# Patient Record
Sex: Male | Born: 1955
Health system: Southern US, Community
[De-identification: ages and names within clinical notes are randomized; demographics above are authoritative.]

## PROBLEM LIST (undated history)

## (undated) DIAGNOSIS — S069XAA Unspecified intracranial injury with loss of consciousness status unknown, initial encounter: Secondary | ICD-10-CM

## (undated) DIAGNOSIS — Z720 Tobacco use: Secondary | ICD-10-CM

## (undated) DIAGNOSIS — I2510 Atherosclerotic heart disease of native coronary artery without angina pectoris: Secondary | ICD-10-CM

## (undated) DIAGNOSIS — I10 Essential (primary) hypertension: Secondary | ICD-10-CM

## (undated) DIAGNOSIS — S069X9A Unspecified intracranial injury with loss of consciousness of unspecified duration, initial encounter: Secondary | ICD-10-CM

---

## 1998-05-15 ENCOUNTER — Ambulatory Visit (HOSPITAL_COMMUNITY): Admission: RE | Admit: 1998-05-15 | Discharge: 1998-05-15 | Payer: Self-pay | Admitting: Family Medicine

## 1999-10-29 ENCOUNTER — Encounter: Payer: Self-pay | Admitting: Emergency Medicine

## 1999-10-29 ENCOUNTER — Emergency Department (HOSPITAL_COMMUNITY): Admission: EM | Admit: 1999-10-29 | Discharge: 1999-10-29 | Payer: Self-pay | Admitting: Emergency Medicine

## 2016-11-23 ENCOUNTER — Inpatient Hospital Stay (HOSPITAL_COMMUNITY)
Admission: EM | Admit: 2016-11-23 | Discharge: 2016-11-26 | DRG: 247 | Disposition: A | Discharge status: Home / Self Care | Payer: Medicaid Other | Attending: Cardiovascular Disease | Admitting: Cardiovascular Disease

## 2016-11-23 ENCOUNTER — Emergency Department (HOSPITAL_COMMUNITY): Payer: Medicaid Other

## 2016-11-23 ENCOUNTER — Encounter (HOSPITAL_COMMUNITY): Payer: Self-pay | Admitting: *Deleted

## 2016-11-23 DIAGNOSIS — E440 Moderate protein-calorie malnutrition: Secondary | ICD-10-CM | POA: Diagnosis present

## 2016-11-23 DIAGNOSIS — I10 Essential (primary) hypertension: Secondary | ICD-10-CM | POA: Diagnosis not present

## 2016-11-23 DIAGNOSIS — I2510 Atherosclerotic heart disease of native coronary artery without angina pectoris: Secondary | ICD-10-CM | POA: Diagnosis present

## 2016-11-23 DIAGNOSIS — F17200 Nicotine dependence, unspecified, uncomplicated: Secondary | ICD-10-CM | POA: Diagnosis present

## 2016-11-23 DIAGNOSIS — I25110 Atherosclerotic heart disease of native coronary artery with unstable angina pectoris: Secondary | ICD-10-CM | POA: Diagnosis present

## 2016-11-23 DIAGNOSIS — Z8249 Family history of ischemic heart disease and other diseases of the circulatory system: Secondary | ICD-10-CM | POA: Diagnosis not present

## 2016-11-23 DIAGNOSIS — I200 Unstable angina: Secondary | ICD-10-CM | POA: Diagnosis not present

## 2016-11-23 DIAGNOSIS — Z955 Presence of coronary angioplasty implant and graft: Secondary | ICD-10-CM

## 2016-11-23 DIAGNOSIS — I214 Non-ST elevation (NSTEMI) myocardial infarction: Secondary | ICD-10-CM | POA: Diagnosis not present

## 2016-11-23 DIAGNOSIS — R001 Bradycardia, unspecified: Secondary | ICD-10-CM | POA: Diagnosis present

## 2016-11-23 DIAGNOSIS — S069XAA Unspecified intracranial injury with loss of consciousness status unknown, initial encounter: Secondary | ICD-10-CM | POA: Diagnosis present

## 2016-11-23 DIAGNOSIS — Z720 Tobacco use: Secondary | ICD-10-CM | POA: Diagnosis present

## 2016-11-23 DIAGNOSIS — S069X9A Unspecified intracranial injury with loss of consciousness of unspecified duration, initial encounter: Secondary | ICD-10-CM | POA: Diagnosis present

## 2016-11-23 DIAGNOSIS — Z87820 Personal history of traumatic brain injury: Secondary | ICD-10-CM

## 2016-11-23 HISTORY — DX: Unspecified intracranial injury with loss of consciousness status unknown, initial encounter: S06.9XAA

## 2016-11-23 HISTORY — DX: Atherosclerotic heart disease of native coronary artery without angina pectoris: I25.10

## 2016-11-23 HISTORY — DX: Essential (primary) hypertension: I10

## 2016-11-23 HISTORY — DX: Unspecified intracranial injury with loss of consciousness of unspecified duration, initial encounter: S06.9X9A

## 2016-11-23 HISTORY — DX: Tobacco use: Z72.0

## 2016-11-23 LAB — CBC
HEMATOCRIT: 37.8 % — AB (ref 39.0–52.0)
HEMOGLOBIN: 12.9 g/dL — AB (ref 13.0–17.0)
MCH: 30.1 pg (ref 26.0–34.0)
MCHC: 34.1 g/dL (ref 30.0–36.0)
MCV: 88.1 fL (ref 78.0–100.0)
Platelets: 226 10*3/uL (ref 150–400)
RBC: 4.29 MIL/uL (ref 4.22–5.81)
RDW: 14.6 % (ref 11.5–15.5)
WBC: 5.2 10*3/uL (ref 4.0–10.5)

## 2016-11-23 LAB — TROPONIN I
TROPONIN I: 0.18 ng/mL — AB (ref <0.03)
TROPONIN I: 0.19 ng/mL — AB (ref <0.03)
Troponin I: 0.2 ng/mL (ref <0.03)

## 2016-11-23 LAB — I-STAT TROPONIN, ED: TROPONIN I, POC: 0.16 ng/mL — AB (ref 0.00–0.08)

## 2016-11-23 LAB — BASIC METABOLIC PANEL
ANION GAP: 8 (ref 5–15)
BUN: 9 mg/dL (ref 6–20)
CALCIUM: 9.7 mg/dL (ref 8.9–10.3)
CO2: 27 mmol/L (ref 22–32)
Chloride: 105 mmol/L (ref 101–111)
Creatinine, Ser: 0.72 mg/dL (ref 0.61–1.24)
GFR calc Af Amer: >60 mL/min (ref >60)
Glucose, Bld: 98 mg/dL (ref 65–99)
POTASSIUM: 3.6 mmol/L (ref 3.5–5.1)
SODIUM: 140 mmol/L (ref 135–145)

## 2016-11-23 LAB — GLUCOSE, CAPILLARY: Glucose-Capillary: 92 mg/dL (ref 65–99)

## 2016-11-23 LAB — HEMOGLOBIN A1C
HEMOGLOBIN A1C: 5.2 % (ref 4.8–5.6)
MEAN PLASMA GLUCOSE: 102.54 mg/dL

## 2016-11-23 MED ORDER — ASPIRIN 300 MG RE SUPP: 300.0000 mg | RECTAL | Status: AC

## 2016-11-23 MED ORDER — NITROGLYCERIN 0.4 MG SL SUBL: 0.4000 mg | SUBLINGUAL_TABLET | SUBLINGUAL | Status: DC | PRN

## 2016-11-23 MED ORDER — ONDANSETRON HCL 4 MG/2ML IJ SOLN: 4.0000 mg | Freq: Four times a day (QID) | INTRAMUSCULAR | Status: DC | PRN

## 2016-11-23 MED ORDER — ASPIRIN 81 MG PO CHEW: 81.0000 mg | CHEWABLE_TABLET | Freq: Every day | ORAL | Status: DC

## 2016-11-23 MED ORDER — ACETAMINOPHEN 325 MG PO TABS: 650.0000 mg | ORAL_TABLET | ORAL | Status: DC | PRN

## 2016-11-23 MED ORDER — ASPIRIN 81 MG PO CHEW
324.0000 mg | CHEWABLE_TABLET | Freq: Once | ORAL | Status: AC
  Administered 2016-11-23: 324 mg via ORAL
  Filled 2016-11-23: qty 4

## 2016-11-23 MED ORDER — ASPIRIN EC 81 MG PO TBEC
81.0000 mg | DELAYED_RELEASE_TABLET | Freq: Every day | ORAL | Status: DC
  Administered 2016-11-24: 81 mg via ORAL
  Filled 2016-11-23 (×2): qty 1

## 2016-11-23 MED ORDER — HYDROCHLOROTHIAZIDE 25 MG PO TABS
25.0000 mg | ORAL_TABLET | Freq: Every day | ORAL | Status: DC
  Administered 2016-11-23 – 2016-11-26 (×4): 25 mg via ORAL
  Filled 2016-11-23 (×4): qty 1

## 2016-11-23 MED ORDER — ASPIRIN 81 MG PO CHEW: 324.0000 mg | CHEWABLE_TABLET | ORAL | Status: AC

## 2016-11-23 MED ORDER — LOSARTAN POTASSIUM 50 MG PO TABS
50.0000 mg | ORAL_TABLET | Freq: Every day | ORAL | Status: DC
  Administered 2016-11-23 – 2016-11-26 (×4): 50 mg via ORAL
  Filled 2016-11-23 (×4): qty 1

## 2016-11-23 MED ORDER — HYDRALAZINE HCL 20 MG/ML IJ SOLN
5.0000 mg | Freq: Once | INTRAMUSCULAR | Status: AC
  Administered 2016-11-23: 5 mg via INTRAVENOUS
  Filled 2016-11-23: qty 1

## 2016-11-23 MED ORDER — ENOXAPARIN SODIUM 100 MG/ML ~~LOC~~ SOLN
1.0000 mg/kg | Freq: Two times a day (BID) | SUBCUTANEOUS | Status: DC
  Administered 2016-11-23 – 2016-11-25 (×4): 85 mg via SUBCUTANEOUS
  Filled 2016-11-23 (×5): qty 1

## 2016-11-23 NOTE — ED Notes (Signed)
Attempted report x1. 

## 2016-11-23 NOTE — ED Notes (Signed)
Critical troponin reported to Dr. Criss Alvine

## 2016-11-23 NOTE — Progress Notes (Signed)
ANTICOAGULATION CONSULT NOTE - Initial Consult  Pharmacy Consult for Lovenox Indication: chest pain/ACS  No Known Allergies  Patient Measurements: Weight: 185 lb (83.9 kg) Heparin Dosing Weight:   Vital Signs: Temp: 98.3 F (36.8 C) (08/18 1142) Temp Source: Oral (08/18 1142) BP: 172/108 (08/18 1430) Pulse Rate: 65 (08/18 1430)  Labs:  Recent Labs  11/23/16 1156 11/23/16 1326  HGB 12.9*  --   HCT 37.8*  --   PLT 226  --   CREATININE 0.72  --   TROPONINI  --  0.20*    CrCl cannot be calculated (Unknown ideal weight.).   Medical History: Past Medical History:  Diagnosis Date  . Hypertension     Assessment: 61 yo male admitted with neck and chest burning and discomfort. +troponin but mildly elevated Appears cardiology will manage conservatively, f/u repeat troponins  Scr 0.7, hgb 12.9, ptls wnl   Goal of Therapy:  Monitor platelets by anticoagulation protocol: Yes    Plan:  Lovenox 85 mg [place] q12h Monitor CBC F/u cardiology plans  B[place] Friday 11/23/2016,3:50 PM

## 2016-11-23 NOTE — ED Notes (Signed)
Attempted report and floor says BP is to high. Paged admitting Dr.

## 2016-11-23 NOTE — ED Triage Notes (Signed)
Pt states he got up and was walking and felt discomfort to left neck and radiated to chest with a burning sensation.  Pt states this has been intermittent, pt states it happened last time 2 days ago.

## 2016-11-23 NOTE — ED Provider Notes (Signed)
[facility]-EMERGENCY DEPT Provider Note   CSN: [id number] Arrival date & time: 11/23/16  1130     History   Chief Complaint Chief Complaint  Patient presents with  . Chest Pain    HPI Cameron West is a 61 y.o. male.  HPI  61 year old male presents with a chief complaint of chest pain. He is an overall poor historian but indicates his been having on-and-off chest pain for several months. He states it only occurs when he is doing significant activities, especially in the heat. Last had the pain yesterday but it was mild. 4 days ago the pain was a lot more severe. He states he gets shortness of breath, left arm pain, and neck pain. Sometimes nauseated but no vomiting. He does state he has a history of hypertension and has at least in the past had diabetes but he has not seen a doctor in several years. He states he smokes every once in a while.  Past Medical History:  Diagnosis Date  . Hypertension     There are no active problems to display for this patient.   History reviewed. No pertinent surgical history.     Home Medications    Prior to Admission medications   Not on File    Family History No family history on file.  Social History Social History  Substance Use Topics  . Smoking status: Current Some Day Smoker  . Smokeless tobacco: Never Used  . Alcohol use No     Allergies   Patient has no known allergies.   Review of Systems Review of Systems  Respiratory: Positive for shortness of breath.   Cardiovascular: Positive for chest pain.  Gastrointestinal: Positive for nausea. Negative for vomiting.  Musculoskeletal: Positive for neck pain.  All other systems reviewed and are negative.    Physical Exam Updated Vital Signs BP (!) 170/110 (BP Location: Left Arm)   Pulse 72   Temp 98.3 F (36.8 C) (Oral)   Resp 17   SpO2 98%   Physical Exam  Constitutional: He is oriented to person, place, and time. He appears well-developed and well-nourished. No  distress.  HENT:  Head: Normocephalic and atraumatic.  Right Ear: External ear normal.  Left Ear: External ear normal.  Nose: Nose normal.  Eyes: Right eye exhibits no discharge. Left eye exhibits no discharge.  Neck: Neck supple.  Cardiovascular: Normal rate, regular rhythm and normal heart sounds.   Pulses:      Radial pulses are 2+ on the right side, and 2+ on the left side.  Pulmonary/Chest: Effort normal and breath sounds normal. He exhibits no tenderness.  Abdominal: Soft. There is no tenderness.  Musculoskeletal: He exhibits no edema.  Neurological: He is alert and oriented to person, place, and time.  Skin: Skin is warm and dry. He is not diaphoretic.  Nursing note and vitals reviewed.    ED Treatments / Results  Labs (all labs ordered are listed, but only abnormal results are displayed) Labs Reviewed  CBC - Abnormal; Notable for the following:       Result Value   Hemoglobin 12.9 (*)    HCT 37.8 (*)    All other components within normal limits  I-STAT TROPONIN, ED - Abnormal; Notable for the following:    Troponin i, poc 0.16 (*)    All other components within normal limits  BASIC METABOLIC PANEL  TROPONIN I    EKG  EKG Interpretation  Date/Time:  Saturday November 23 2016 11:37:59 EDT Ventricular  Rate:  74 PR Interval:  178 QRS Duration: 88 QT Interval:  372 QTC Calculation: 412 R Axis:   81 Text Interpretation:  Normal sinus rhythm no acute ST/T changes No old tracing to compare Confirmed by Pricilla Loveless [phone]) on 11/23/2016 12:50:37 PM       EKG Interpretation  Date/Time:  Saturday November 23 2016 13:13:28 EDT Ventricular Rate:  72 PR Interval:  178 QRS Duration: 91 QT Interval:  376 QTC Calculation: 412 R Axis:   79 Text Interpretation:  Sinus rhythm Minimal ST elevation, anterior leads no significant change compared to earlier in the day Confirmed by Pricilla Loveless [phone]) on 11/23/2016 1:25:00 PM        Radiology Dg Chest 2  View  Result Date: 11/23/2016 CLINICAL DATA:  Pt reports left side chest pain x 2 days that happens intermittently; smoker; hx of HTN EXAM: CHEST  2 VIEW COMPARISON:  None. FINDINGS: The heart size and mediastinal contours are within normal limits. Both lungs are clear. No pleural effusion or pneumothorax. The visualized skeletal structures are unremarkable. IMPRESSION: No active cardiopulmonary disease. Electronically Signed   By: Amie Portland M.D.   On: 11/23/2016 12:31    Procedures Procedures (including critical care time)  Medications Ordered in ED Medications  aspirin chewable tablet 324 mg (324 mg Oral Given 11/23/16 1325)     Initial Impression / Assessment and Plan / ED Course  I have reviewed the triage vital signs and the nursing notes.  Pertinent labs & imaging results that were available during my care of the patient were reviewed by me and considered in my medical decision making (see chart for details).     No active chest pain now or earlier today. History of progressive angina with exertion. Now with troponin leak, though currently pain free/stable. Cardiology consulted, they will admit.  Final Clinical Impressions(s) / ED Diagnoses   Final diagnoses:  NSTEMI (non-ST elevated myocardial infarction) [facility])    New Prescriptions New Prescriptions   No medications on file     Pricilla Loveless, MD 11/23/16 [phone]

## 2016-11-23 NOTE — Consult Note (Signed)
Cardiology H&P   Patient ID: Cameron West; [id number]; May 16, 1955   Admit date: 11/23/2016 Date of Consult: 11/23/2016  Primary Care Provider: Patient, No Pcp Per Primary Cardiologist: New, Nahser     Patient Profile:   Cameron West is a 61 y.o. male with a hx of hypertension who is being seen today for the evaluation of chest pain at the request of Dr. Criss Alvine, ER, physician.   History of Present Illness:   [name] came to the emergency room at the request of his son for prior complaints of chest pain. The patient is not having chest pain at all today nor is he having chest pain in the ER. The patient has had some chest discomfort on and off when he is outside in the heat. He states that the pain begins in his left shoulder and radiates into his chest. Because he has been having this intermittently, his son felt he needed to be evaluated. The patient does not have a primary care physician. He is very vague on reasons that he does not. He apparently has had a history of hypertension but does not regularly take medications nor does he see a physician regularly. He uses the emergency room for any medical needs that he has.  On arrival in the emergency room the patient was hypertensive with a blood pressure 170/110, heart rate 72 O2 sat 98%. She was afebrile. Pertinent labs revealed a troponin of 0.20. An 0.16 respectively. Other labs include potassium 3.6, creatinine 0.72. Hemoglobin 12.9, hematocrit 37.8. EKG designated normal sinus rhythm without acute ST-T wave abnormalities. EKG revealed no active cardiopulmonary disease. The patient was treated with aspirin 324 mg. He remains hypertensive.    Past Medical History:  Diagnosis Date  . Hypertension     History reviewed. No pertinent surgical history.   Home Medications:  Prior to Admission medications   Not on File    Inpatient Medications: Scheduled Meds:  Continuous Infusions:  PRN Meds:   Allergies:   No Known  Allergies  Social History:   Social History   Social History  . Marital status: Single    Spouse name: N/A  . Number of children: N/A  . Years of education: N/A   Occupational History  . Not on file.   Social History Main Topics  . Smoking status: Current Some Day Smoker  . Smokeless tobacco: Never Used  . Alcohol use No  . Drug use: No  . Sexual activity: Not on file   Other Topics Concern  . Not on file   Social History Narrative  . No narrative on file    Family History:   Patient is a poor historian. Uncertain but thinks family history of HTN and Family History  Problem Relation Age of Onset  . Hypertension Mother      ROS:  Please see the history of present illness.  ROSAll other ROS reviewed and negative.     Physical Exam/Data:   Vitals:   11/23/16 1142 11/23/16 1400 11/23/16 1430  BP: (!) 170/110 (!) 165/99 (!) 172/108  Pulse: 72 64 65  Resp: 17 16 17   Temp: 98.3 F (36.8 C)    TempSrc: Oral    SpO2: 98% 100% 99%   No intake or output data in the 24 hours ending 11/23/16 1443 There were no vitals filed for this visit. There is no height or weight on file to calculate BMI.    General:  Well nourished, well developed, in no  acute distress HEENT: normal Lymph: no adenopathy Neck: no JVD Endocrine:  No thryomegaly Vascular: No carotid bruits; FA pulses 2+ bilaterally without bruits  Cardiac:  normal S1, S2; RRR; no murmur  Lungs:  Clear to auscultation bilaterally, no wheezing, rhonchi or rales  Abd: soft, nontender, no hepatomegaly  Ext: no edema Musculoskeletal:  No deformities, BUE and BLE strength normal and equal Skin: warm and dry  Neuro:  CNs 2-12 intact, no focal abnormalities noted Psych:  Normal affect  Poor historian. Requires frequent redirecting.   EKG:  The EKG was personally reviewed and demonstrates: NSR,  Telemetry:  Telemetry was personally reviewed and demonstrates:  SR   Relevant CV Studies: None  Laboratory  Data:  Chemistry  Recent Labs Lab 11/23/16 1156  NA 140  K 3.6  CL 105  CO2 27  GLUCOSE 98  BUN 9  CREATININE 0.72  CALCIUM 9.7  GFRNONAA >60  GFRAA >60  ANIONGAP 8    No results for input(s): PROT, ALBUMIN, AST, ALT, ALKPHOS, BILITOT in the last 168 hours. Hematology  Recent Labs Lab 11/23/16 1156  WBC 5.2  RBC 4.29  HGB 12.9*  HCT 37.8*  MCV 88.1  MCH 30.1  MCHC 34.1  RDW 14.6  PLT 226   Cardiac Enzymes  Recent Labs Lab 11/23/16 1326  TROPONINI 0.20*     Recent Labs Lab 11/23/16 1205  TROPIPOC 0.16*    BNPNo results for input(s): BNP, PROBNP in the last 168 hours.  DDimer No results for input(s): DDIMER in the last 168 hours.  Radiology/Studies:  Dg Chest 2 View  Result Date: 11/23/2016 CLINICAL DATA:  Pt reports left side chest pain x 2 days that happens intermittently; smoker; hx of HTN EXAM: CHEST  2 VIEW COMPARISON:  None. FINDINGS: The heart size and mediastinal contours are within normal limits. Both lungs are clear. No pleural effusion or pneumothorax. The visualized skeletal structures are unremarkable. IMPRESSION: No active cardiopulmonary disease. Electronically Signed   By: Amie Portland M.D.   On: 11/23/2016 12:31    Assessment and Plan:   1. Chest pain: Typical atypical features, usually associated with being out in the heat according to the patient. States he begins in his left shoulder and radiates into his chest. He denies any diaphoresis dizziness or dyspnea associated. The pain in his chest has not occurred for a few days, but on the insistence of his son he was asked to come the emergency room to be checked out further. The patient does not have a primary care physician. The patient states that he only feels the discomfort when he is outside intermittently.  EKG does not reveal any acute ST-T wave abnormalities.  Troponins are elevated however at 0.16 and 0.20. We'll plan echocardiogram for evaluation of LV function in the setting of  uncontrolled hypertension. Uncertain that he will need to proceed with catheterization at this time however this will be a possibility troponins continue to increase. We'll provide potassium replacement as he is low normal at 3.6. Creatinine is 0.72.  2. Hypertension: Uncontrolled. Patient does not take medications at home. He does not have a primary care physician nor does he see a physician regularly other than coming to the ER for any health problems. We'll begin low-dose losartan and HcTZ daily for better blood pressure control. As stated creatinine is 0.72. Will not add beta blocker as heart rate is 65 bpm current.    Signed, Joni Reining, NP  11/23/2016 2:43 PM   Attending  Note:   The patient was seen and examined.  Agree with assessment and plan as noted above.  Changes made to the above note as needed.  Patient seen and independently examined with Joni Reining, NP .   We discussed all aspects of the encounter. I agree with the assessment and plan as stated above.  1. Chest discomfort: Symptoms are consistent with unstable angina. His history is somewhat questional. He tends to ramble quite a bit in his discussion. I'm concerned about the onset of chest pain with radiation to the neck and shoulder that has been occurring for least several months. While these can happen at anytime, but seemed to be more common when he is hot or when he is exerting himself.  He's EKG is normal. His troponin level is very minimally elevated.  Clinically it does not appear that he's had a pulmonary embolus. He is not short of breath at rest and he is not hypoxic or tachycardic.  We'll admit and cycle cardiac enzymes. If his troponins increased further, will need to consider heart catheterization.  We'll start him on aspirin.   2. Essential hypertension: His blood pressures in the 170s. He has not been on any blood pressure medications. He does not go to see the doctor on any regular basis. We'll  start him on an H2 tends in blocker and HCTZ. It is slow so we will hold off on a beta blocker for now although we will need to consider adding beta blocker if he is found have coronary artery disease.     I have spent a total of 40 minutes with patient reviewing hospital  notes , telemetry, EKGs, labs and examining patient as well as establishing an assessment and plan that was discussed with the patient. > 50% of time was spent in direct patient care.    Vesta Mixer, Montez Hageman., MD, [place] [facility] 11/23/2016, 3:16 PM [address],  [address] Office [phone] Pager [phone]

## 2016-11-24 DIAGNOSIS — I214 Non-ST elevation (NSTEMI) myocardial infarction: Principal | ICD-10-CM

## 2016-11-24 LAB — LIPID PANEL
Cholesterol: 140 mg/dL (ref 0–200)
HDL: 42 mg/dL (ref >40)
LDL CALC: 83 mg/dL (ref 0–99)
TRIGLYCERIDES: 74 mg/dL (ref <150)
Total CHOL/HDL Ratio: 3.3 RATIO
VLDL: 15 mg/dL (ref 0–40)

## 2016-11-24 LAB — TROPONIN I: TROPONIN I: 0.15 ng/mL — AB (ref <0.03)

## 2016-11-24 LAB — HIV ANTIBODY (ROUTINE TESTING W REFLEX): HIV Screen 4th Generation wRfx: NONREACTIVE

## 2016-11-24 MED ORDER — ATORVASTATIN CALCIUM 40 MG PO TABS
40.0000 mg | ORAL_TABLET | Freq: Every day | ORAL | Status: DC
  Administered 2016-11-24: 40 mg via ORAL
  Filled 2016-11-24: qty 1

## 2016-11-24 MED ORDER — SODIUM CHLORIDE 0.9 % IV SOLN
250.0000 mL | INTRAVENOUS | Status: DC | PRN
  Administered 2016-11-24: 250 mL via INTRAVENOUS

## 2016-11-24 MED ORDER — SODIUM CHLORIDE 0.9% FLUSH: 3.0000 mL | INTRAVENOUS | Status: DC | PRN

## 2016-11-24 MED ORDER — SODIUM CHLORIDE 0.9% FLUSH: 3.0000 mL | Freq: Two times a day (BID) | INTRAVENOUS | Status: DC

## 2016-11-24 MED ORDER — ASPIRIN 81 MG PO CHEW
81.0000 mg | CHEWABLE_TABLET | ORAL | Status: AC
  Administered 2016-11-25: 81 mg via ORAL
  Filled 2016-11-24: qty 1

## 2016-11-24 MED ORDER — SODIUM CHLORIDE 0.9 % WEIGHT BASED INFUSION
3.0000 mL/kg/h | INTRAVENOUS | Status: DC
  Administered 2016-11-25: 3 mL/kg/h via INTRAVENOUS

## 2016-11-24 MED ORDER — SODIUM CHLORIDE 0.9 % WEIGHT BASED INFUSION
1.0000 mL/kg/h | INTRAVENOUS | Status: DC
  Administered 2016-11-25: 1 mL/kg/h via INTRAVENOUS

## 2016-11-24 NOTE — Progress Notes (Signed)
Progress Note  Patient Name: Cameron West Date of Encounter: 11/24/2016  Primary Cardiologist: New, Nahser  Subjective   61 yo ,  Admitted with angina symptoms  troponins are elevated.    Inpatient Medications    Scheduled Meds: . aspirin  324 mg Oral NOW   Or  . aspirin  300 mg Rectal NOW  . aspirin EC  81 mg Oral Daily  . enoxaparin (LOVENOX) injection  1 mg/kg Subcutaneous Q12H  . hydrochlorothiazide  25 mg Oral Daily  . losartan  50 mg Oral Daily   Continuous Infusions:  PRN Meds: acetaminophen, nitroGLYCERIN, ondansetron (ZOFRAN) IV   Vital Signs    Vitals:   11/24/16 0637 11/24/16 0800 11/24/16 0906 11/24/16 1316  BP: (!) 138/97 (!) 147/92 (!) 135/99 131/82  Pulse: 86 68 77 86  Resp: (!) 23 16 11 15   Temp:   98 F (36.7 C) 97.7 F (36.5 C)  TempSrc:   Oral Oral  SpO2: 99% 98% 97% 100%  Weight:      Height:        Intake/Output Summary (Last 24 hours) at 11/24/16 1325 Last data filed at 11/24/16 0120  Gross per 24 hour  Intake                0 ml  Output              650 ml  Net             -650 ml   Filed Weights   11/23/16 1538 11/23/16 2018  Weight: 185 lb (83.9 kg) 182 lb 9.6 oz (82.8 kg)    Telemetry    NSR  - Personally Reviewed  ECG     NSR  - Personally Reviewed  Physical Exam   GEN: No acute distress.   Neck: No JVD Cardiac: RRR, no murmurs, rubs, or gallops.  Respiratory: Clear to auscultation bilaterally. GI: Soft, nontender, non-distended  MS: No edema; No deformity. Neuro:  Nonfocal  Psych: Normal affect   Labs    Chemistry Recent Labs Lab 11/23/16 1156  NA 140  K 3.6  CL 105  CO2 27  GLUCOSE 98  BUN 9  CREATININE 0.72  CALCIUM 9.7  GFRNONAA >60  GFRAA >60  ANIONGAP 8     Hematology Recent Labs Lab 11/23/16 1156  WBC 5.2  RBC 4.29  HGB 12.9*  HCT 37.8*  MCV 88.1  MCH 30.1  MCHC 34.1  RDW 14.6  PLT 226    Cardiac Enzymes Recent Labs Lab 11/23/16 1326 11/23/16 1646 11/23/16 2059  11/24/16 0237  TROPONINI 0.20* 0.18* 0.19* 0.15*    Recent Labs Lab 11/23/16 1205  TROPIPOC 0.16*     BNPNo results for input(s): BNP, PROBNP in the last 168 hours.   DDimer No results for input(s): DDIMER in the last 168 hours.   Radiology    Dg Chest 2 View  Result Date: 11/23/2016 CLINICAL DATA:  Pt reports left side chest pain x 2 days that happens intermittently; smoker; hx of HTN EXAM: CHEST  2 VIEW COMPARISON:  None. FINDINGS: The heart size and mediastinal contours are within normal limits. Both lungs are clear. No pleural effusion or pneumothorax. The visualized skeletal structures are unremarkable. IMPRESSION: No active cardiopulmonary disease. Electronically Signed   By: Amie Portland M.D.   On: 11/23/2016 12:31    Cardiac Studies      Patient Profile     62 y.o. male admitted with unstable  angaina   Assessment & Plan    1.  Unstable angian :  troponins are +  Will add to cath board tomorrow I've discussed risks, benefits, options. He understands and agrees to proceed.   No CP today     Signed, Kristeen Miss, MD  11/24/2016, 1:25 PM

## 2016-11-24 NOTE — Progress Notes (Signed)
Pt alert and oriented, no sign of acute distress, no c/o  Pain, BP elevated, see flow sheet,  On call physician -Andi Devon, Blout T, notified, new orders received, Hydralazine 5 mg IV administered. Pt resting in bed, call bell placed within reach. Will recheck BP.

## 2016-11-24 NOTE — Progress Notes (Addendum)
Assumed care from off going RN; patient alert & oriented; patient shows no signs of acute distress; patient up in chair ; patient advised to call or use the call bell before getting up; patient denies SOB; denies pain; will continue to monitor patient   Spoke to son Ethelene Browns [phone] wants the MD to call him about updates; and case management needed for this patient; patient aware of heart cath tomorrow; consent signed; second IV placed; portable tele monitor attached; will give report to on coming RN

## 2016-11-25 ENCOUNTER — Encounter (HOSPITAL_COMMUNITY)
Admission: EM | Disposition: A | Discharge status: Home / Self Care | Payer: Self-pay | Source: Home / Self Care | Attending: Cardiovascular Disease

## 2016-11-25 ENCOUNTER — Inpatient Hospital Stay (HOSPITAL_COMMUNITY): Payer: Medicaid Other

## 2016-11-25 DIAGNOSIS — I10 Essential (primary) hypertension: Secondary | ICD-10-CM

## 2016-11-25 DIAGNOSIS — I2510 Atherosclerotic heart disease of native coronary artery without angina pectoris: Secondary | ICD-10-CM

## 2016-11-25 DIAGNOSIS — I214 Non-ST elevation (NSTEMI) myocardial infarction: Secondary | ICD-10-CM

## 2016-11-25 HISTORY — PX: LEFT HEART CATH AND CORONARY ANGIOGRAPHY: CATH118249

## 2016-11-25 HISTORY — PX: CORONARY STENT INTERVENTION: CATH118234

## 2016-11-25 LAB — ECHOCARDIOGRAM COMPLETE
Height: 76 in
Weight: 2784 oz

## 2016-11-25 LAB — POCT ACTIVATED CLOTTING TIME: ACTIVATED CLOTTING TIME: 329 s

## 2016-11-25 SURGERY — LEFT HEART CATH AND CORONARY ANGIOGRAPHY
Anesthesia: LOCAL

## 2016-11-25 MED ORDER — MORPHINE SULFATE (PF) 4 MG/ML IV SOLN: 2.0000 mg | INTRAVENOUS | Status: DC | PRN

## 2016-11-25 MED ORDER — LIDOCAINE HCL 1 % IJ SOLN
INTRAMUSCULAR | Status: AC
  Filled 2016-11-25: qty 20

## 2016-11-25 MED ORDER — LIDOCAINE HCL (PF) 1 % IJ SOLN
INTRAMUSCULAR | Status: DC | PRN
  Administered 2016-11-25: 30 mL via INTRADERMAL

## 2016-11-25 MED ORDER — IOPAMIDOL (ISOVUE-370) INJECTION 76%
INTRAVENOUS | Status: AC
  Filled 2016-11-25: qty 100

## 2016-11-25 MED ORDER — BIVALIRUDIN BOLUS VIA INFUSION - CUPID
INTRAVENOUS | Status: DC | PRN
  Administered 2016-11-25: 59.175 mg via INTRAVENOUS

## 2016-11-25 MED ORDER — LABETALOL HCL 5 MG/ML IV SOLN: 10.0000 mg | INTRAVENOUS | Status: AC | PRN

## 2016-11-25 MED ORDER — SODIUM CHLORIDE 0.9 % IV SOLN
INTRAVENOUS | Status: AC | PRN
  Administered 2016-11-25: 17:00:00
  Administered 2016-11-25: 1.75 mg/kg/h via INTRAVENOUS

## 2016-11-25 MED ORDER — ASPIRIN 81 MG PO CHEW
81.0000 mg | CHEWABLE_TABLET | Freq: Every day | ORAL | Status: DC
  Administered 2016-11-26: 81 mg via ORAL
  Filled 2016-11-25: qty 1

## 2016-11-25 MED ORDER — ANGIOPLASTY BOOK
Freq: Once | Status: AC
  Administered 2016-11-25: 20:00:00
  Filled 2016-11-25: qty 1

## 2016-11-25 MED ORDER — SODIUM CHLORIDE 0.9% FLUSH: 3.0000 mL | INTRAVENOUS | Status: DC | PRN

## 2016-11-25 MED ORDER — BIVALIRUDIN TRIFLUOROACETATE 250 MG IV SOLR
INTRAVENOUS | Status: AC
  Filled 2016-11-25: qty 250

## 2016-11-25 MED ORDER — ACTIVE PARTNERSHIP FOR HEALTH OF YOUR HEART BOOK
Freq: Once | Status: AC
  Administered 2016-11-25: 20:00:00
  Filled 2016-11-25: qty 1

## 2016-11-25 MED ORDER — SODIUM CHLORIDE 0.9 % IV SOLN: 250.0000 mL | INTRAVENOUS | Status: DC | PRN

## 2016-11-25 MED ORDER — TICAGRELOR 90 MG PO TABS
ORAL_TABLET | ORAL | Status: DC | PRN
  Administered 2016-11-25: 180 mg via ORAL

## 2016-11-25 MED ORDER — NITROGLYCERIN 1 MG/10 ML FOR IR/CATH LAB
INTRA_ARTERIAL | Status: DC | PRN
  Administered 2016-11-25 (×2): 200 ug via INTRACORONARY

## 2016-11-25 MED ORDER — HYDRALAZINE HCL 20 MG/ML IJ SOLN: 5.0000 mg | INTRAMUSCULAR | Status: AC | PRN

## 2016-11-25 MED ORDER — HEART ATTACK BOUNCING BOOK
Freq: Once | Status: AC
  Administered 2016-11-25: 20:00:00
  Filled 2016-11-25: qty 1

## 2016-11-25 MED ORDER — TICAGRELOR 90 MG PO TABS
90.0000 mg | ORAL_TABLET | Freq: Two times a day (BID) | ORAL | Status: DC
  Administered 2016-11-26 (×2): 90 mg via ORAL
  Filled 2016-11-25 (×2): qty 1

## 2016-11-25 MED ORDER — ACETAMINOPHEN 325 MG PO TABS: 650.0000 mg | ORAL_TABLET | ORAL | Status: DC | PRN

## 2016-11-25 MED ORDER — SODIUM CHLORIDE 0.9 % IV SOLN
1.7500 mg/kg/h | INTRAVENOUS | Status: AC
  Administered 2016-11-25: 1.75 mg/kg/h via INTRAVENOUS
  Filled 2016-11-25: qty 250

## 2016-11-25 MED ORDER — SODIUM CHLORIDE 0.9% FLUSH: 3.0000 mL | Freq: Two times a day (BID) | INTRAVENOUS | Status: DC

## 2016-11-25 MED ORDER — IOPAMIDOL (ISOVUE-370) INJECTION 76%
INTRAVENOUS | Status: AC
  Filled 2016-11-25: qty 50

## 2016-11-25 MED ORDER — TICAGRELOR 90 MG PO TABS
ORAL_TABLET | ORAL | Status: AC
  Filled 2016-11-25: qty 2

## 2016-11-25 MED ORDER — HEPARIN (PORCINE) IN NACL 2-0.9 UNIT/ML-% IJ SOLN
INTRAMUSCULAR | Status: AC | PRN
  Administered 2016-11-25: 1000 mL via INTRA_ARTERIAL

## 2016-11-25 MED ORDER — NITROGLYCERIN 1 MG/10 ML FOR IR/CATH LAB
INTRA_ARTERIAL | Status: AC
  Filled 2016-11-25: qty 10

## 2016-11-25 MED ORDER — HEPARIN (PORCINE) IN NACL 2-0.9 UNIT/ML-% IJ SOLN
INTRAMUSCULAR | Status: AC
  Filled 2016-11-25: qty 1000

## 2016-11-25 MED ORDER — IOHEXOL 350 MG/ML SOLN
INTRAVENOUS | Status: DC | PRN
  Administered 2016-11-25: 210 mL via INTRAVENOUS

## 2016-11-25 MED ORDER — ONDANSETRON HCL 4 MG/2ML IJ SOLN: 4.0000 mg | Freq: Four times a day (QID) | INTRAMUSCULAR | Status: DC | PRN

## 2016-11-25 MED ORDER — ENOXAPARIN SODIUM 80 MG/0.8ML ~~LOC~~ SOLN: 80.0000 mg | Freq: Two times a day (BID) | SUBCUTANEOUS | Status: DC

## 2016-11-25 MED ORDER — SODIUM CHLORIDE 0.9 % IV SOLN: INTRAVENOUS | Status: AC

## 2016-11-25 SURGICAL SUPPLY — 17 items
BALLN SAPPHIRE 2.0X12 (BALLOONS) ×2
BALLOON SAPPHIRE 2.0X12 (BALLOONS) ×1 IMPLANT
CATH GUIDE ADROIT 6FR XBLAD3.5 (CATHETERS) ×2 IMPLANT
CATH INFINITI 5 FR 3DRC (CATHETERS) ×2 IMPLANT
CATH INFINITI MULTIPACK ST 5F (CATHETERS) ×2 IMPLANT
CATH LAUNCHER 6FR AL.75 (CATHETERS) ×2 IMPLANT
KIT ENCORE 26 ADVANTAGE (KITS) ×2 IMPLANT
KIT HEART LEFT (KITS) ×2 IMPLANT
PACK CARDIAC CATHETERIZATION (CUSTOM PROCEDURE TRAY) ×2 IMPLANT
SHEATH PINNACLE 5F 10CM (SHEATH) ×2 IMPLANT
SHEATH PINNACLE 6F 10CM (SHEATH) ×2 IMPLANT
STENT SYNERGY DES 2.25X16 (Permanent Stent) ×2 IMPLANT
SYR MEDRAD MARK V 150ML (SYRINGE) ×2 IMPLANT
TRANSDUCER W/STOPCOCK (MISCELLANEOUS) ×2 IMPLANT
TUBING CIL FLEX 10 FLL-RA (TUBING) ×2 IMPLANT
WIRE ASAHI PROWATER 180CM (WIRE) ×2 IMPLANT
WIRE EMERALD 3MM-J .035X150CM (WIRE) ×2 IMPLANT

## 2016-11-25 NOTE — Progress Notes (Signed)
Initial Nutrition Assessment  DOCUMENTATION CODES:   Non-severe (moderate) malnutrition in context of chronic illness  INTERVENTION:    Advance diet as medically appropriate, add supplements when able  NUTRITION DIAGNOSIS:   Malnutrition (moderate) related to other (see comment) (? etiology, inadequate oral intake ?) as evidenced by moderate depletion of body fat, moderate depletions of muscle mass  GOAL:   Patient will meet greater than or equal to 90% of their needs  MONITOR:   Diet advancement, PO intake, Supplement acceptance, Labs, Weight trends, I & O's  REASON FOR ASSESSMENT:   Malnutrition Screening Tool  ASSESSMENT:   61 y.o. Male with a hx of hypertension who was admitted for the evaluation of chest pain at the request of Dr. Criss Alvine, ER, physician.   Pt reports a good appetite.  States he consumes 2 meals per day; lives with his son. Currently NPO for procedure, however, would benefit from supplements. He endorses weight loss yet unable to quantify amount or time frame. Labs and medications reviewed. CBG 92.  Nutrition-Focused physical exam completed. Findings are severe fat depletion, severe muscle depletion, and severe edema.   Diet Order:  Diet NPO time specified Except for: Sips with Meds  Skin:  Reviewed, no issues  Last BM:  8/18  Height:   Ht Readings from Last 1 Encounters:  11/25/16 6\' 4"  (1.93 m)    Weight:   Wt Readings from Last 1 Encounters:  11/25/16 174 lb (78.9 kg)    Ideal Body Weight:  91.8 kg  BMI:  Body mass index is 21.18 kg/m.  Estimated Nutritional Needs:   Kcal:  1900-2100  Protein:  95-110 gm  Fluid:  1.9-2.1 L  EDUCATION NEEDS:   No education needs identified at this time  Maureen Chatters, RD, LDN Pager #: [phone] After-Hours Pager #: [phone]

## 2016-11-25 NOTE — Interval H&P Note (Signed)
Cath Lab Visit (complete for each Cath Lab visit)  Clinical Evaluation Leading to the Procedure:   ACS: Yes.    Non-ACS:    Anginal Classification: CCS III  Anti-ischemic medical therapy: No Therapy  Non-Invasive Test Results: No non-invasive testing performed  Prior CABG: No previous CABG      History and Physical Interval Note:  11/25/2016 3:35 PM  Cameron West  has presented today for surgery, with the diagnosis of cp  The various methods of treatment have been discussed with the patient and family. After consideration of risks, benefits and other options for treatment, the patient has consented to  Procedure(s): LEFT HEART CATH AND CORONARY ANGIOGRAPHY (N/A) as a surgical intervention .  The patient's history has been reviewed, patient examined, no change in status, stable for surgery.  I have reviewed the patient's chart and labs.  Questions were answered to the patient's satisfaction.     Nanetta Batty

## 2016-11-25 NOTE — H&P (View-Only) (Signed)
Progress Note  Patient Name: Cameron West Date of Encounter: 11/25/2016  Primary Cardiologist: Nahser  Subjective   Feeling well, no chest pain.   Inpatient Medications    Scheduled Meds: . aspirin EC  81 mg Oral Daily  . atorvastatin  40 mg Oral q1800  . enoxaparin (LOVENOX) injection  80 mg Subcutaneous Q12H  . hydrochlorothiazide  25 mg Oral Daily  . losartan  50 mg Oral Daily  . sodium chloride flush  3 mL Intravenous Q12H   Continuous Infusions: . sodium chloride 250 mL (11/24/16 1816)  . sodium chloride 1 mL/kg/hr (11/25/16 0718)   PRN Meds: sodium chloride, acetaminophen, nitroGLYCERIN, ondansetron (ZOFRAN) IV, sodium chloride flush   Vital Signs    Vitals:   11/25/16 0059 11/25/16 0524 11/25/16 0742 11/25/16 0917  BP: 100/61 120/89 117/86   Pulse:   63   Resp: 15  18   Temp:  97.9 F (36.6 C) 97.9 F (36.6 C)   TempSrc:  Oral Oral   SpO2:   98%   Weight:    174 lb 8 oz (79.2 kg)  Height:        Intake/Output Summary (Last 24 hours) at 11/25/16 1101 Last data filed at 11/25/16 0917  Gross per 24 hour  Intake           674.38 ml  Output                0 ml  Net           674.38 ml   Filed Weights   11/23/16 1538 11/23/16 2018 11/25/16 0917  Weight: 185 lb (83.9 kg) 182 lb 9.6 oz (82.8 kg) 174 lb 8 oz (79.2 kg)    Telemetry    SB with PACs - Personally Reviewed  ECG    SR - Personally Reviewed  Physical Exam   General: Well developed, well nourished, AA male appearing in no acute distress. Head: Normocephalic, atraumatic.  Neck: Supple without bruits, JVD. Lungs:  Resp regular and unlabored, CTA. Heart: RRR, S1, S2, no S3, S4, or murmur; no rub. Abdomen: Soft, non-tender, non-distended with normoactive bowel sounds. No hepatomegaly. No rebound/guarding. No obvious abdominal masses. Extremities: No clubbing, cyanosis, edema. Distal pedal pulses are 2+ bilaterally. Neuro: Alert and oriented X 3. Moves all extremities spontaneously. Psych:  Normal affect.  Labs    Chemistry Recent Labs Lab 11/23/16 1156  NA 140  K 3.6  CL 105  CO2 27  GLUCOSE 98  BUN 9  CREATININE 0.72  CALCIUM 9.7  GFRNONAA >60  GFRAA >60  ANIONGAP 8     Hematology Recent Labs Lab 11/23/16 1156  WBC 5.2  RBC 4.29  HGB 12.9*  HCT 37.8*  MCV 88.1  MCH 30.1  MCHC 34.1  RDW 14.6  PLT 226    Cardiac Enzymes Recent Labs Lab 11/23/16 1326 11/23/16 1646 11/23/16 2059 11/24/16 0237  TROPONINI 0.20* 0.18* 0.19* 0.15*    Recent Labs Lab 11/23/16 1205  TROPIPOC 0.16*     BNPNo results for input(s): BNP, PROBNP in the last 168 hours.   DDimer No results for input(s): DDIMER in the last 168 hours.    Radiology    Dg Chest 2 View  Result Date: 11/23/2016 CLINICAL DATA:  Pt reports left side chest pain x 2 days that happens intermittently; smoker; hx of HTN EXAM: CHEST  2 VIEW COMPARISON:  None. FINDINGS: The heart size and mediastinal contours are within normal limits. Both lungs  are clear. No pleural effusion or pneumothorax. The visualized skeletal structures are unremarkable. IMPRESSION: No active cardiopulmonary disease. Electronically Signed   By: Amie Portland M.D.   On: 11/23/2016 12:31    Cardiac Studies   N/a  Patient Profile     61 y.o. male with PMH of HTN who presented with chest pain and positive troponins.   Assessment & Plan    1. Unstable Angina: No further chest pain reported. Troponin peaked at 0.20. Given reported symptoms he was set up for cardiac cath today. No questions this morning. Hgb A1c 5.2, and LDL 83. -- on ASA, statin, ARB. No BB given bradycardia.   2. HTN: Noted to be significantly hypertensive on admission. Much improved with current therapy.   Signed, Laverda Page, NP  11/25/2016, 11:01 AM  Pager # [phone]   Patient seen, examined. Available data reviewed. Agree with findings, assessment, and plan as outlined by Laverda Page, NP-C. Exam reveals an alert, oriented man in no  distress. Lungs are clear. Heart is bradycardic and regular without murmur or gallop. Abdomen is soft and nontender with no masses appreciated. Extremities show no edema.  Data his reviewed. The patient's troponin is mildly elevated and he presented with chest discomfort that has occurred when he is out of the heat. All lab information is reviewed. Radiographic data is reviewed. He is scheduled for cardiac catheterization and possible PCI later today. I reviewed risks, indications, and alternatives with the patient who understands and agrees to proceed. Current medicines will be continued in order to treat his hypertension which was previously uncontrolled. Need to avoid a beta blocker because of bradycardia. Further disposition pending his heart catheterization result.  Tonny Bollman, M.D. 11/25/2016 2:35 PM

## 2016-11-25 NOTE — Progress Notes (Signed)
Progress Note  Patient Name: Cameron West Date of Encounter: 11/25/2016  Primary Cardiologist: Nahser  Subjective   Feeling well, no chest pain.   Inpatient Medications    Scheduled Meds: . aspirin EC  81 mg Oral Daily  . atorvastatin  40 mg Oral q1800  . enoxaparin (LOVENOX) injection  80 mg Subcutaneous Q12H  . hydrochlorothiazide  25 mg Oral Daily  . losartan  50 mg Oral Daily  . sodium chloride flush  3 mL Intravenous Q12H   Continuous Infusions: . sodium chloride 250 mL (11/24/16 1816)  . sodium chloride 1 mL/kg/hr (11/25/16 0718)   PRN Meds: sodium chloride, acetaminophen, nitroGLYCERIN, ondansetron (ZOFRAN) IV, sodium chloride flush   Vital Signs    Vitals:   11/25/16 0059 11/25/16 0524 11/25/16 0742 11/25/16 0917  BP: 100/61 120/89 117/86   Pulse:   63   Resp: 15  18   Temp:  97.9 F (36.6 C) 97.9 F (36.6 C)   TempSrc:  Oral Oral   SpO2:   98%   Weight:    174 lb 8 oz (79.2 kg)  Height:        Intake/Output Summary (Last 24 hours) at 11/25/16 1101 Last data filed at 11/25/16 0917  Gross per 24 hour  Intake           674.38 ml  Output                0 ml  Net           674.38 ml   Filed Weights   11/23/16 1538 11/23/16 2018 11/25/16 0917  Weight: 185 lb (83.9 kg) 182 lb 9.6 oz (82.8 kg) 174 lb 8 oz (79.2 kg)    Telemetry    SB with PACs - Personally Reviewed  ECG    SR - Personally Reviewed  Physical Exam   General: Well developed, well nourished, AA male appearing in no acute distress. Head: Normocephalic, atraumatic.  Neck: Supple without bruits, JVD. Lungs:  Resp regular and unlabored, CTA. Heart: RRR, S1, S2, no S3, S4, or murmur; no rub. Abdomen: Soft, non-tender, non-distended with normoactive bowel sounds. No hepatomegaly. No rebound/guarding. No obvious abdominal masses. Extremities: No clubbing, cyanosis, edema. Distal pedal pulses are 2+ bilaterally. Neuro: Alert and oriented X 3. Moves all extremities spontaneously. Psych:  Normal affect.  Labs    Chemistry Recent Labs Lab 11/23/16 1156  NA 140  K 3.6  CL 105  CO2 27  GLUCOSE 98  BUN 9  CREATININE 0.72  CALCIUM 9.7  GFRNONAA >60  GFRAA >60  ANIONGAP 8     Hematology Recent Labs Lab 11/23/16 1156  WBC 5.2  RBC 4.29  HGB 12.9*  HCT 37.8*  MCV 88.1  MCH 30.1  MCHC 34.1  RDW 14.6  PLT 226    Cardiac Enzymes Recent Labs Lab 11/23/16 1326 11/23/16 1646 11/23/16 2059 11/24/16 0237  TROPONINI 0.20* 0.18* 0.19* 0.15*    Recent Labs Lab 11/23/16 1205  TROPIPOC 0.16*     BNPNo results for input(s): BNP, PROBNP in the last 168 hours.   DDimer No results for input(s): DDIMER in the last 168 hours.    Radiology    Dg Chest 2 View  Result Date: 11/23/2016 CLINICAL DATA:  Pt reports left side chest pain x 2 days that happens intermittently; smoker; hx of HTN EXAM: CHEST  2 VIEW COMPARISON:  None. FINDINGS: The heart size and mediastinal contours are within normal limits. Both lungs  are clear. No pleural effusion or pneumothorax. The visualized skeletal structures are unremarkable. IMPRESSION: No active cardiopulmonary disease. Electronically Signed   By: Amie Portland M.D.   On: 11/23/2016 12:31    Cardiac Studies   N/a  Patient Profile     61 y.o. male with PMH of HTN who presented with chest pain and positive troponins.   Assessment & Plan    1. Unstable Angina: No further chest pain reported. Troponin peaked at 0.20. Given reported symptoms he was set up for cardiac cath today. No questions this morning. Hgb A1c 5.2, and LDL 83. -- on ASA, statin, ARB. No BB given bradycardia.   2. HTN: Noted to be significantly hypertensive on admission. Much improved with current therapy.   Signed, Laverda Page, NP  11/25/2016, 11:01 AM  Pager # [phone]   Patient seen, examined. Available data reviewed. Agree with findings, assessment, and plan as outlined by Laverda Page, NP-C. Exam reveals an alert, oriented man in no  distress. Lungs are clear. Heart is bradycardic and regular without murmur or gallop. Abdomen is soft and nontender with no masses appreciated. Extremities show no edema.  Data his reviewed. The patient's troponin is mildly elevated and he presented with chest discomfort that has occurred when he is out of the heat. All lab information is reviewed. Radiographic data is reviewed. He is scheduled for cardiac catheterization and possible PCI later today. I reviewed risks, indications, and alternatives with the patient who understands and agrees to proceed. Current medicines will be continued in order to treat his hypertension which was previously uncontrolled. Need to avoid a beta blocker because of bradycardia. Further disposition pending his heart catheterization result.  Tonny Bollman, M.D. 11/25/2016 2:35 PM

## 2016-11-25 NOTE — Progress Notes (Signed)
Site area: right groin  Site Prior to Removal:  Level 0  Pressure Applied For 15 MINUTES    Minutes Beginning at 23:00  Manual:   Yes.    Patient Status During Pull:  WNL  Post Pull Groin Site:  Level 0  Post Pull Instructions Given:  Yes.    Post Pull Pulses Present:  Yes.    Dressing Applied:  Yes.    Comments:  Pt tolerated well.

## 2016-11-25 NOTE — Care Management Note (Signed)
Case Management Note Donn Pierini RN, BSN Unit 4E-Case Manager [phone]  Patient Details  Name: DAOUD VANTHOF MRN: [id number] Date of Birth: Jul 27, 1955  Subjective/Objective:   Pt admitted with [place]- plan cardiac cath 8/20                 Action/Plan: PTA pt lived at home- with son and SO- referral received regarding d/c concerns from older son- Ethelene Browns- call made to speak with Ethelene Browns- #[phone] per Sonny Dandy expresses concern about pt's living situation- stating that pt lives with younger son and SO- however SO has stated that pt needs to move out- uncertain as to when??- Ethelene Browns states that he can not provide pt a place to stay so unsure where pt would go - pt also in need of a PCP- will set pt up with clinic prior to discharge. Ethelene Browns also expressed concern about pt's ability to remember things since head injury about 2 years ago when he was at work?. CM will f/u with pt regarding these concerns post cath.   Expected Discharge Date:                  Expected Discharge Plan:     In-House Referral:     Discharge planning Services  CM Consult, [facility]  Post Acute Care Choice:    Choice offered to:     DME Arranged:    DME Agency:     HH Arranged:    HH Agency:     Status of Service:  In process, will continue to follow  If discussed at Long Length of Stay Meetings, dates discussed:    Discharge Disposition:   Additional Comments:  Darrold Span, RN 11/25/2016, 10:32 AM

## 2016-11-25 NOTE — Progress Notes (Signed)
  Echocardiogram 2D Echocardiogram has been performed.  Leta Jungling M 11/25/2016, 3:00 PM

## 2016-11-26 ENCOUNTER — Encounter (HOSPITAL_COMMUNITY): Payer: Self-pay | Admitting: Cardiovascular Disease

## 2016-11-26 DIAGNOSIS — I10 Essential (primary) hypertension: Secondary | ICD-10-CM | POA: Diagnosis present

## 2016-11-26 DIAGNOSIS — S069X9A Unspecified intracranial injury with loss of consciousness of unspecified duration, initial encounter: Secondary | ICD-10-CM | POA: Diagnosis present

## 2016-11-26 DIAGNOSIS — I2510 Atherosclerotic heart disease of native coronary artery without angina pectoris: Secondary | ICD-10-CM | POA: Diagnosis present

## 2016-11-26 DIAGNOSIS — Z720 Tobacco use: Secondary | ICD-10-CM | POA: Diagnosis present

## 2016-11-26 DIAGNOSIS — S069XAA Unspecified intracranial injury with loss of consciousness status unknown, initial encounter: Secondary | ICD-10-CM | POA: Diagnosis present

## 2016-11-26 LAB — BASIC METABOLIC PANEL
Anion gap: 8 (ref 5–15)
BUN: 12 mg/dL (ref 6–20)
CALCIUM: 9.6 mg/dL (ref 8.9–10.3)
CHLORIDE: 102 mmol/L (ref 101–111)
CO2: 26 mmol/L (ref 22–32)
CREATININE: 0.76 mg/dL (ref 0.61–1.24)
Glucose, Bld: 94 mg/dL (ref 65–99)
Potassium: 3.5 mmol/L (ref 3.5–5.1)
SODIUM: 136 mmol/L (ref 135–145)

## 2016-11-26 LAB — CBC
HCT: 40.5 % (ref 39.0–52.0)
Hemoglobin: 13.8 g/dL (ref 13.0–17.0)
MCH: 29.8 pg (ref 26.0–34.0)
MCHC: 34.1 g/dL (ref 30.0–36.0)
MCV: 87.5 fL (ref 78.0–100.0)
PLATELETS: 240 10*3/uL (ref 150–400)
RBC: 4.63 MIL/uL (ref 4.22–5.81)
RDW: 14.4 % (ref 11.5–15.5)
WBC: 5.8 10*3/uL (ref 4.0–10.5)

## 2016-11-26 MED ORDER — ATORVASTATIN CALCIUM 40 MG PO TABS: 40.0000 mg | ORAL_TABLET | Freq: Every day | ORAL | 2 refills | Status: DC

## 2016-11-26 MED ORDER — LOSARTAN POTASSIUM 50 MG PO TABS: 50.0000 mg | ORAL_TABLET | Freq: Every day | ORAL | 2 refills | Status: DC

## 2016-11-26 MED ORDER — ASPIRIN 81 MG PO CHEW: 81.0000 mg | CHEWABLE_TABLET | Freq: Every day | ORAL | Status: AC

## 2016-11-26 MED ORDER — HYDROCHLOROTHIAZIDE 25 MG PO TABS: 25.0000 mg | ORAL_TABLET | Freq: Every day | ORAL | 2 refills | Status: DC

## 2016-11-26 MED ORDER — NITROGLYCERIN 0.4 MG SL SUBL: 0.4000 mg | SUBLINGUAL_TABLET | SUBLINGUAL | 12 refills | Status: DC | PRN

## 2016-11-26 MED ORDER — TICAGRELOR 90 MG PO TABS
90.0000 mg | ORAL_TABLET | Freq: Two times a day (BID) | ORAL | 4 refills | Status: DC
Start: 2016-11-26 — End: 2018-05-27

## 2016-11-26 NOTE — Progress Notes (Signed)
[phone] Pt has been up and down halls with morning with NT so did not walk. He is out of room at this time with staff. Son will not be here until five pm. I have written out ex ed and left heart healthy diet and CRP 2 brochure for [place]. Pt's RN stated she will stress importance of brilinta and give ed materials when family arrive as we are not here at that time. Left handout on smoking cessation. Pt is not able to comprehend ed materials at this time. Will refer to [place] program for CRP 2. Luetta Nutting RN BSN 11/26/2016 9:49 AM

## 2016-11-26 NOTE — Progress Notes (Signed)
[phone] Called by RN that pt's son here now. Reviewed MI ed with pt and son. Son will reinforce ed with father. Discussed MI restrictions, NTG use, heart healthy diet, importance of brilinta with stent, and ex ed. Discussed CRP 2 and will refer. Son stated transportation will be an issue.  Discussed smoking cessation and gave fake cigarette. Plans to quit cold [place] as son stated not going to give him anymore. Luetta Nutting RN BSN 11/26/2016 11:20 AM

## 2016-11-26 NOTE — Discharge Summary (Addendum)
Discharge Summary    Patient ID: Cameron West,  MRN: [id number], DOB/AGE: 59-Aug-1957 59 y.o.  Admit date: 11/23/2016 Discharge date: 11/26/2016  Primary Care Provider: Patient, No Pcp Per Primary Cardiologist: Dr. Elease Hashimoto    Discharge Diagnoses    Principal Problem:   NSTEMI (non-ST elevated myocardial infarction) [facility]) Active Problems:   CAD (coronary artery disease)   Hypertension   Tobacco abuse   Traumatic brain injury (HCC)   Allergies No Known Allergies   History of Present Illness    Cameron West is a 61 y.o. male with a history of HTN not on any medications and no previous cardiac disease who presented to [facility] on [facility]16 with chest pain and ruled in for NSTEMI.   He lives here in [place] with his girlfriend and two sons. There is some documentation that he may have previously had a TBI, but details are unclear. He presented with atypical chest pain that was worse when outside in the heat. He only had a history of HTN but had not taken any medications or seen a medical professional in many years. His cardiac enzymes were noted to be elevated, so he was admitted to the cardiology service for further evaluation and work up.    Hospital Course     Consultants: none  NSTEMI: troponin 0.20--> 0.18--> 0.19--> 0.15. 2D ECHO showed normal LV function, mod LVH and mild diastolic dysfunction. LHC on 11/25/16 showed 70% mid-dist RCA stenosis with vasospasm resolving with intracoronary nitroglycerin and 95% ost D1 s/p PCI/DES. He was placed on DAPT with ASA and Brilinta. LDL 83. I will add atorvastatin 40mg  daily. No BB given baseline sinus bradycardia.   HTN: he was started on Losartan 50mg  daily and HCTZ 25mg  daily with good BP control. Would recommend checking a BMET at follow up  Tobacco abuse: he is not a heavy smoker but says he is willing to quit completely.   The patient has had an uncomplicated hospital course and is recovering well. The femoral catheter site is  stable. He has been seen by Dr. Excell Seltzer today and deemed ready for discharge home. All follow-up appointments have been scheduled. Smoking cessation was disscussed in length. A written RX for a 30 day free supply of Brilinta was provided for the patient. Discharge medications are listed below.  _____________  Discharge Vitals Blood pressure (!) 157/79, pulse 77, temperature 97.7 F (36.5 C), temperature source Oral, resp. rate (!) 21, height 6\' 4"  (1.93 m), weight 177 lb 0.5 oz (80.3 kg), SpO2 98 %.  Filed Weights   11/25/16 0917 11/25/16 1356 11/26/16 0502  Weight: 174 lb 8 oz (79.2 kg) 174 lb (78.9 kg) 177 lb 0.5 oz (80.3 kg)   VS:  BP (!) 157/79   Pulse 77   Temp 97.7 F (36.5 C) (Oral)   Resp (!) 21   Ht 6\' 4"  (1.93 m)   Wt 177 lb 0.5 oz (80.3 kg)   SpO2 98%   BMI 21.55 kg/m    GEN: Well nourished, well developed, in no acute distress  HEENT: normal  Neck: no JVD, carotid bruits, or masses Cardiac: RRR; no murmurs, rubs, or gallops,no edema  Respiratory:  clear to auscultation bilaterally, normal work of breathing GI: soft, nontender, nondistended, + BS MS: no deformity or atrophy  Skin: warm and dry, no rash Neuro:  Alert and Oriented x 3, Strength and sensation are intact Psych: euthymic mood, full affect   Labs & Radiologic Studies  CBC  Recent Labs  11/23/16 1156 11/26/16 0205  WBC 5.2 5.8  HGB 12.9* 13.8  HCT 37.8* 40.5  MCV 88.1 87.5  PLT 226 240   Basic Metabolic Panel  Recent Labs  11/23/16 1156 11/26/16 0205  NA 140 136  K 3.6 3.5  CL 105 102  CO2 27 26  GLUCOSE 98 94  BUN 9 12  CREATININE 0.72 0.76  CALCIUM 9.7 9.6   Liver Function Tests No results for input(s): AST, ALT, ALKPHOS, BILITOT, PROT, ALBUMIN in the last 72 hours. No results for input(s): LIPASE, AMYLASE in the last 72 hours. Cardiac Enzymes  Recent Labs  11/23/16 1646 11/23/16 2059 11/24/16 0237  TROPONINI 0.18* 0.19* 0.15*   BNP Invalid input(s):  POCBNP D-Dimer No results for input(s): DDIMER in the last 72 hours. Hemoglobin A1C  Recent Labs  11/23/16 1646  HGBA1C 5.2   Fasting Lipid Panel  Recent Labs  11/24/16 0237  CHOL 140  HDL 42  LDLCALC 83  TRIG 74  CHOLHDL 3.3   Thyroid Function Tests No results for input(s): TSH, T4TOTAL, T3FREE, THYROIDAB in the last 72 hours.  Invalid input(s): FREET3  Dg Chest 2 View  Result Date: 11/23/2016 CLINICAL DATA:  Pt reports left side chest pain x 2 days that happens intermittently; smoker; hx of HTN EXAM: CHEST  2 VIEW COMPARISON:  None. FINDINGS: The heart size and mediastinal contours are within normal limits. Both lungs are clear. No pleural effusion or pneumothorax. The visualized skeletal structures are unremarkable. IMPRESSION: No active cardiopulmonary disease. Electronically Signed   By: Amie Portland M.D.   On: 11/23/2016 12:31     Diagnostic Studies/Procedures    11/25/16 CORONARY STENT INTERVENTION  LEFT HEART CATH AND CORONARY ANGIOGRAPHY  Conclusion  DATE OF PROCEDURE:  11/25/2016  Mid RCA to Dist RCA lesion, 70 %stenosed.  Ost 1st Diag to 1st Diag lesion, 95 %stenosed.  Post intervention, there is a 0% residual stenosis.  A stent was successfully placed.  The left ventricular systolic function is normal.  LV end diastolic pressure is normal.  The left ventricular ejection fraction is 55-65% by visual estimate.   Cameron West is a 61 y.o. male    [id number] LOCATION:  FACILITY: [facility]  PHYSICIAN: Nanetta Batty, M.D. 08-03-1955       CARDIAC CATHETERIZATION / PCI/DES-Diag  History obtained from chart review.61 y.o.malewith PMH of HTN who presented with chest pain and positive troponins.   IMPRESSION:successful first diagonal branch PCI and stenting in the setting of a high-grade lesion in a patient with a non-STEMI. He did have long segmental moderately high-grade distal dominant RCA stenosis which responded to intracoronary  nitroglycerin and resolved. This does raise the question of whether the diagonal branch was related to spasm although despite balloon dilatation the remaining residual stenosis. He had normal LV function. The guidewire and catheter were removed. Sheath was secured. The patient will continue on full dose Angiomax for 4 hours prior to its discontinuation. The sheath will be removed and after Angiomax is discontinued and pressure held. He'll be hydrated overnight. Antiplatelet therapy will continue uninterrupted for 12 months. He left the lab in stable condition.    _____________  2D ECHO: 11/25/2016 LV EF: 55% -   60% Study Conclusions - Left ventricle: The cavity size was normal. Wall thickness was   increased in a pattern of moderate LVH. Systolic function was   normal. The estimated ejection fraction was in the range of 55%   to  60%. Wall motion was normal; there were no regional wall   motion abnormalities. Doppler parameters are consistent with   abnormal left ventricular relaxation (grade 1 diastolic   dysfunction). Impressions: - Normal LV systolic function; moderate LVH; mild diastolic   dysfunction.  Disposition   Pt is being discharged home today in good condition.  Follow-up Plans & Appointments    Follow-up Information    Beatrice Lecher, PA-C. Go on 12/03/2016.   Specialties:  Cardiology, Physician Assistant Why:  @ 8:45am, please arrive at least 10 minutes early Contact information: [address] [place] [address] [phone]          Discharge Instructions    AMB Referral to Cardiac Rehabilitation - Phase II    Complete by:  As directed    Diagnosis:  NSTEMI   Amb Referral to Cardiac Rehabilitation    Complete by:  As directed    Diagnosis:   Coronary Stents NSTEMI        Discharge Medications     Medication List    TAKE these medications   ARTIFICIAL TEARS 1.4 % ophthalmic solution Generic drug:  polyvinyl alcohol Place  1-2 drops into both eyes 3 (three) times daily as needed for dry eyes.   aspirin 81 MG chewable tablet Chew 1 tablet (81 mg total) by mouth daily.   atorvastatin 40 MG tablet Commonly known as:  LIPITOR Take 1 tablet (40 mg total) by mouth daily.   hydrochlorothiazide 25 MG tablet Commonly known as:  HYDRODIURIL Take 1 tablet (25 mg total) by mouth daily.   losartan 50 MG tablet Commonly known as:  COZAAR Take 1 tablet (50 mg total) by mouth daily.   nitroGLYCERIN 0.4 MG SL tablet Commonly known as:  NITROSTAT Place 1 tablet (0.4 mg total) under the tongue every 5 (five) minutes x 3 doses as needed for chest pain.   ticagrelor 90 MG Tabs tablet Commonly known as:  BRILINTA Take 1 tablet (90 mg total) by mouth 2 (two) times daily.       Aspirin prescribed at discharge?  Yes High Intensity Statin Prescribed? (Lipitor 40-80mg  or Crestor 20-40mg ): Yes Beta Blocker Prescribed? No: bradycardia For EF 45% or less, Was ACEI/ARB Prescribed? Yes, Ef nl ADP Receptor Inhibitor Prescribed? (i.e. Plavix etc.-Includes Medically Managed Patients): Yes For EF <45%, Aldosterone Inhibitor Prescribed? No, Ef nl Was EF assessed during THIS hospitalization? Yes Was Cardiac Rehab II ordered? (Included Medically managed Patients): Yes   Outstanding Labs/Studies   BMET  Duration of Discharge Encounter   Greater than 30 minutes including physician time.  Signed, Cline Crock PA-C 11/26/2016, 10:50 AM  Patient seen, examined. Available data reviewed. Agree with findings, assessment, and plan as outlined by Cline Crock PA-C. On my exam today: Vitals:   11/26/16 0502 11/26/16 0700  BP: 116/79 (!) 157/79  Pulse: 66 77  Resp: 16 (!) 21  Temp: 98.1 F (36.7 C) 97.7 F (36.5 C)  SpO2: 98% 98%   Pt is alert and oriented, NAD HEENT: normal Neck: JVP - normal Lungs: CTA bilaterally CV: RRR without murmur or gallop Abd: soft, NT, Positive BS, no hepatomegaly Ext: no C/C/E, distal  pulses intact and equal. Right groin site clear Skin: warm/dry no rash  S/p PCI yesterday after presenting with NSTEMI. Films reviewed. Medications reviewed - ASA, brilinta, statin. No beta-blocker with resting bradycardia. Otherwise as above. Appears stable for discharge.   Tonny Bollman, M.D. 11/26/2016 10:50 AM

## 2016-11-27 ENCOUNTER — Telehealth: Payer: Self-pay | Admitting: Cardiovascular Disease

## 2016-11-27 NOTE — Telephone Encounter (Signed)
Matthias Hughs Linde Gillis) calling, states that she received referral for [facility] and an RN will visit patient on Friday. Please call

## 2016-11-27 NOTE — Telephone Encounter (Signed)
Spoke with Aliene Altes, Facilities manager at [facility] regarding the request for home health. Per Dr. Elease Hashimoto, he is not aware that home health was ordered for this patient. I asked her about the nature of the request and she states the order requests RN Assessment following cardiac cath for CAD including medication management and compliance and social issues. The frequency of the visits is unknown until patient is evaluated by [facility]. I asked if [facility] could be involved from a primary care perspective as the patient's problems are likely to be far reaching. She offered their phone number [facility] [phone]). I thanked her for her help. Spoke with Donn Pierini, Case manager at [facility] who advised that patient's PCP will be [facility] and he has an appointment on 8/31.

## 2016-11-28 ENCOUNTER — Telehealth: Payer: Self-pay

## 2016-11-28 NOTE — Telephone Encounter (Signed)
Spoke with Cameron West, Facilities manager at [facility] who states [facility] is willing to sign home health orders as long as patient keeps his appointment. I thanked her for her help.

## 2016-11-28 NOTE — Telephone Encounter (Signed)
Call received from Lisa,RN/[facility] confirming the patient's appointment [facility] next week, 12/06/16.  She noted that she has explained to the patient and caregiver the importance of keeping this appointment and establishing care as they will need orders from a provider  to continue to provide home health care services.

## 2016-12-02 DIAGNOSIS — E785 Hyperlipidemia, unspecified: Secondary | ICD-10-CM | POA: Insufficient documentation

## 2016-12-02 NOTE — Progress Notes (Signed)
Cardiology Office Note:    Date:  12/03/2016   ID:  Cameron West, DOB [birth year], MRN [id number]  PCP:  Cameron West  Cardiologist:  Dr. Delane Ginger    Referring MD: No ref. provider found   Chief Complaint  Cameron West presents with  . Hospitalization Follow-up    s/p MI >> PCI    History of Present Illness:    Cameron West is a 61 y.o. male with a hx of HTN, tobacco abuse who was admitted 8/18-8/20 with a NSTEMI.  Cardiac Catheterization demonstrated mRCA 70% suspicious for vasospasm after IC NTG and 95% ostial D1 stenosis that was treated with a DES.  He was not started on beta-blocker due to bradycardia.  He was placed on Losartan for HTN.   Cameron West returns for post hospitalization follow-up. He is here alone today. He is somewhat vague about his history. He has noted some chest discomfort but denies recurrent angina. It is not clear if his chest pain is exertional. It is fairly brief. He denies shortness of breath. He denies PND. He denies syncope.  Prior CV studies:   The following studies were reviewed today:  Echo 11/25/16 Mod LVH, EF 55-60, no RWMA, Gr 1 DD  Cardiac Catheterization 11/25/16 D1 ost 95 RCA mid 70 EF 55-65 PCI: 2.25 x 16 mm Synergy DES to D1 Post-Intervention Diagram     Past Medical History:  Diagnosis Date  . CAD (coronary artery disease)    a. 11/2016: NSTEMI: 70% mid-dist RCA stenosis with vasospasm resolving with intracoronary NTG and 95% ost D1 s/p DES  . Hypertension   . Tobacco abuse   . Traumatic brain injury [facility])    a. West son report    Past Surgical History:  Procedure Laterality Date  . CORONARY STENT INTERVENTION N/A 11/25/2016   Procedure: CORONARY STENT INTERVENTION;  Surgeon: Runell Gess, MD;  [facility];  Service: Cardiovascular;  Laterality: N/A;  . LEFT HEART CATH AND CORONARY ANGIOGRAPHY N/A 11/25/2016   Procedure: LEFT HEART CATH AND CORONARY ANGIOGRAPHY;  Surgeon: Runell Gess, MD;  Location:  [facility];  Service: Cardiovascular;  Laterality: N/A;    Current Medications: Current Meds  Medication Sig  . aspirin 81 MG chewable tablet Chew 1 tablet (81 mg total) by mouth daily.  Marland Kitchen atorvastatin (LIPITOR) 40 MG tablet Take 1 tablet (40 mg total) by mouth daily.  . hydrochlorothiazide (HYDRODIURIL) 25 MG tablet Take 1 tablet (25 mg total) by mouth daily.  Marland Kitchen losartan (COZAAR) 50 MG tablet Take 1 tablet (50 mg total) by mouth daily.  . nitroGLYCERIN (NITROSTAT) 0.4 MG SL tablet Place 1 tablet (0.4 mg total) under the tongue every 5 (five) minutes x 3 doses as needed for chest pain.  . polyvinyl alcohol (ARTIFICIAL TEARS) 1.4 % ophthalmic solution Place 1-2 drops into both eyes 3 (three) times daily as needed for dry eyes.  . ticagrelor (BRILINTA) 90 MG TABS tablet Take 1 tablet (90 mg total) by mouth 2 (two) times daily.     Allergies:   Cameron West has no known allergies.   Social History   Social History  . Marital status: Single    Spouse name: N/A  . Number of children: N/A  . Years of education: N/A   Social History Main Topics  . Smoking status: Current Some Day Smoker  . Smokeless tobacco: Never Used  . Alcohol use No  . Drug use: No  . Sexual  activity: Not Asked   Other Topics Concern  . None   Social History Narrative  . None     Family Hx: The Cameron West's family history includes Hypertension in his mother.  ROS:   Please see the history of present illness.    ROS All other systems reviewed and are negative.   EKGs/Labs/Other Test Reviewed:    EKG:  EKG is  ordered today.  The ekg ordered today demonstrates Sinus bradycardia, HR 56, normal axis, nonspecific ST-T wave changes, QTc 403 ms  Recent Labs: 11/26/2016: Hemoglobin 13.8; Platelets 240 12/03/2016: BUN 13; Creatinine, Ser 0.68; Potassium 3.8; Sodium 139   Recent Lipid Panel Lab Results  Component Value Date/Time   CHOL 140 11/24/2016 02:37 AM   TRIG 74 11/24/2016 02:37 AM   HDL 42  11/24/2016 02:37 AM   CHOLHDL 3.3 11/24/2016 02:37 AM   LDLCALC 83 11/24/2016 02:37 AM    Physical Exam:    VS:  BP 130/74   Pulse (!) 56   Ht 6\' 4"  (1.93 m)   Wt 176 lb 1.9 oz (79.9 kg)   SpO2 99%   BMI 21.44 kg/m     Wt Readings from Last 3 Encounters:  12/03/16 176 lb 1.9 oz (79.9 kg)  11/26/16 177 lb 0.5 oz (80.3 kg)     Physical Exam  Constitutional: He is oriented to person, place, and time. He appears well-developed and well-nourished. No distress.  HENT:  Head: Normocephalic and atraumatic.  Eyes: No scleral icterus.  Neck: Normal range of motion. No JVD present.  Cardiovascular: Normal rate, regular rhythm, S1 normal and S2 normal.   No murmur heard. Pulmonary/Chest: Effort normal and breath sounds normal. He has no wheezes. He has no rhonchi. He has no rales.  Abdominal: Soft. There is no hepatomegaly.  Musculoskeletal: He exhibits no edema or deformity (R FA site without hematoma or bruit).  Neurological: He is alert and oriented to person, place, and time.  Skin: Skin is warm and dry.  Psychiatric: He has a normal mood and affect.    ASSESSMENT:    1. NSTEMI (non-ST elevated myocardial infarction) (HCC)   2. Coronary artery disease involving native coronary artery of native heart without angina pectoris   3. Essential hypertension   4. Hyperlipidemia, unspecified hyperlipidemia type   5. Tobacco abuse    PLAN:    In order of problems listed above:  1. NSTEMI (non-ST elevated myocardial infarction) [facility]) S/p NSTEMI tx with DES to the Dx. He has residual disease in the RCA (suspicious for vasospasm) tx medically.  He has had some chest pain but it seems that he is denying recurrent angina. We discussed the importance of dual antiplatelet therapy. I have specifically informed him that he should not miss a dose of Brilinta and ASA.  He is not interested in formal cardiac rehabilitation.  -  Continue ASA, Brilinta, statin, angiotensin receptor blocker.  -  No  beta-blocker due to bradycardia    2. Coronary artery disease involving native coronary artery of native heart without angina pectoris As noted, he has residual disease in the RCA. With intracoronary nitroglycerin, this appeared to be vasospasm. This is treated medically. He is complaining of some chest discomfort but this is somewhat atypical. For now I would continue current therapy. If he continues to have chest discomfort, consider adding amlodipine versus nitrates.  3. Essential hypertension The Cameron West's blood pressure is controlled on his current regimen.  Continue current therapy.  Check BMET today.  4. Hyperlipidemia, unspecified hyperlipidemia type Continue moderate intensity statin. Arrange lipids and LFTs in 6-8 weeks.  5. Tobacco abuse He has quit smoking.  Dispo:  Return in about 3 months (around 03/05/2017) for Routine Follow Up, w/ Dr. Elease Hashimoto.   Medication Adjustments/Labs and Tests Ordered: Current medicines are reviewed at length with the Cameron West today.  Concerns regarding medicines are outlined above.  Tests Ordered: Orders Placed This Encounter  Procedures  . Lipid Profile  . Hepatic function panel  . Basic Metabolic Panel (BMET)  . Lipid Profile  . Hepatic function panel  . Basic Metabolic Panel (BMET)  . EKG 12-Lead   Medication Changes: No orders of the defined types were placed in this encounter.   Signed, Tereso Newcomer, PA-C  12/03/2016 6:07 PM    [facility] [address] [place], [place], [place]  [address] Phone: [phone]; Fax: [phone]

## 2016-12-03 ENCOUNTER — Encounter: Payer: Self-pay | Admitting: Physician Assistant

## 2016-12-03 ENCOUNTER — Telehealth: Payer: Self-pay | Admitting: *Deleted

## 2016-12-03 ENCOUNTER — Ambulatory Visit (INDEPENDENT_AMBULATORY_CARE_PROVIDER_SITE_OTHER): Payer: Medicaid Other | Admitting: Physician Assistant

## 2016-12-03 ENCOUNTER — Encounter (INDEPENDENT_AMBULATORY_CARE_PROVIDER_SITE_OTHER): Payer: Self-pay

## 2016-12-03 VITALS — BP 130/74 | HR 56 | Ht 76.0 in | Wt 176.1 lb

## 2016-12-03 DIAGNOSIS — I2510 Atherosclerotic heart disease of native coronary artery without angina pectoris: Secondary | ICD-10-CM

## 2016-12-03 DIAGNOSIS — E785 Hyperlipidemia, unspecified: Secondary | ICD-10-CM | POA: Diagnosis not present

## 2016-12-03 DIAGNOSIS — I214 Non-ST elevation (NSTEMI) myocardial infarction: Secondary | ICD-10-CM | POA: Diagnosis not present

## 2016-12-03 DIAGNOSIS — Z720 Tobacco use: Secondary | ICD-10-CM | POA: Diagnosis not present

## 2016-12-03 DIAGNOSIS — I10 Essential (primary) hypertension: Secondary | ICD-10-CM | POA: Diagnosis not present

## 2016-12-03 LAB — BASIC METABOLIC PANEL
BUN / CREAT RATIO: 19 (ref 9–20)
BUN: 13 mg/dL (ref 6–24)
CALCIUM: 10 mg/dL (ref 8.7–10.2)
CO2: 23 mmol/L (ref 20–29)
Chloride: 100 mmol/L (ref 96–106)
Creatinine, Ser: 0.68 mg/dL — ABNORMAL LOW (ref 0.76–1.27)
GFR, EST AFRICAN AMERICAN: 121 mL/min/{1.73_m2} (ref >59)
GFR, EST NON AFRICAN AMERICAN: 105 mL/min/{1.73_m2} (ref >59)
Glucose: 89 mg/dL (ref 65–99)
Potassium: 3.8 mmol/L (ref 3.5–5.2)
Sodium: 139 mmol/L (ref 134–144)

## 2016-12-03 NOTE — Telephone Encounter (Signed)
Lmtcb to go over lab results 

## 2016-12-03 NOTE — Patient Instructions (Addendum)
Medication Instructions:  Your physician recommends that you continue on your current medications as directed. Please refer to the Current Medication list given to you today.   Labwork:  BMET TODAY   01/14/17 FASTING LIPID/LIVER PANEL,  COME I ANYTIME FIRST THING IN THE MORNING , NOTHING TO EAT OR DRINK AFTER MIDNIGHT THE NIGHT BEFORE LAB WORK  Testing/Procedures: NONE ORDERED   Follow-Up: 02/11/17 @ 8 AM WITH DR. Elease Hashimoto  Any Other Special Instructions Will Be Listed Below (If Applicable).     If you need a refill on your cardiac medications before your next appointment, please call your pharmacy.

## 2016-12-03 NOTE — Telephone Encounter (Signed)
-----   Message from Beatrice Lecher, [place] sent at 12/03/2016  4:50 PM EDT ----- Please call patient: The kidney function (BUN, Creatinine) and potassium are normal. All other parameters are within acceptable limits and no further intervention or testing required. Continue with current treatment plan. Tereso Newcomer, PA-C    12/03/2016 4:50 PM

## 2016-12-06 ENCOUNTER — Ambulatory Visit: Payer: Medicaid Other | Attending: Internal Medicine | Admitting: Physician Assistant

## 2016-12-06 VITALS — BP 130/73 | HR 64 | Temp 98.5°F | Resp 18 | Ht 72.0 in | Wt 182.0 lb

## 2016-12-06 DIAGNOSIS — Z87820 Personal history of traumatic brain injury: Secondary | ICD-10-CM | POA: Diagnosis not present

## 2016-12-06 DIAGNOSIS — I10 Essential (primary) hypertension: Secondary | ICD-10-CM | POA: Insufficient documentation

## 2016-12-06 DIAGNOSIS — Z7982 Long term (current) use of aspirin: Secondary | ICD-10-CM | POA: Insufficient documentation

## 2016-12-06 DIAGNOSIS — R001 Bradycardia, unspecified: Secondary | ICD-10-CM | POA: Insufficient documentation

## 2016-12-06 DIAGNOSIS — I214 Non-ST elevation (NSTEMI) myocardial infarction: Secondary | ICD-10-CM | POA: Diagnosis not present

## 2016-12-06 DIAGNOSIS — I2510 Atherosclerotic heart disease of native coronary artery without angina pectoris: Secondary | ICD-10-CM | POA: Insufficient documentation

## 2016-12-06 DIAGNOSIS — Z720 Tobacco use: Secondary | ICD-10-CM | POA: Diagnosis not present

## 2016-12-06 DIAGNOSIS — Z955 Presence of coronary angioplasty implant and graft: Secondary | ICD-10-CM | POA: Insufficient documentation

## 2016-12-06 NOTE — Patient Instructions (Signed)
Take all medications as prescribed Remember that Brilinta is twice per day No smoking

## 2016-12-06 NOTE — Progress Notes (Signed)
Cameron West  ZOX:[id number]  [id number]:[id number]  DOB - Oct 13, 1955  Chief Complaint  Patient presents with  . Hospitalization Follow-up    NSTEMI       Subjective:   Cameron West is a 61 y.o. male here today for Establishment of care. He has a history of hypertension and traumatic brain injury. He was admitted 8/18-8/20 with a NSTEMI.  Cardiac Catheterization demonstrated mRCA 70% suspicious for vasospasm after IC NTG (no stent) and 95% ostial D1 stenosis that was treated with a drug eluding stent.  He was not started on beta-blocker due to bradycardia.  He was placed on Losartan for HTN. His troponin peaked at 0.19. His LDL was 83.  Since being released from the hospital he's been seen in follow-up by cardiology. No medication changes were made at that time. He was doing very well.  He continues to state that he is doing okay. No chest pain. No shortness of breath. No syncope. No palpitations. Compliant with his medications.   ROS: GEN: denies fever or chills, denies change in weight Skin: denies lesions or rashes HEENT: denies headache, earache, epistaxis, sore throat, or neck pain LUNGS: denies SHOB, dyspnea, PND, orthopnea CV: denies CP or palpitations ABD: denies abd pain, N or V EXT: denies muscle spasms or swelling; no pain in lower ext, no weakness NEURO: denies numbness or tingling, denies sz, stroke or TIA  ALLERGIES: No Known Allergies  PAST MEDICAL HISTORY: Past Medical History:  Diagnosis Date  . CAD (coronary artery disease)    a. 11/2016: NSTEMI: 70% mid-dist RCA stenosis with vasospasm resolving with intracoronary NTG and 95% ost D1 s/p DES  . Hypertension   . Tobacco abuse   . Traumatic brain injury [facility])    a. per son report    PAST SURGICAL HISTORY: Past Surgical History:  Procedure Laterality Date  . CORONARY STENT INTERVENTION N/A 11/25/2016   Procedure: CORONARY STENT INTERVENTION;  Surgeon: Runell Gess, MD;  [facility]: [facility];  Service:  Cardiovascular;  Laterality: N/A;  . LEFT HEART CATH AND CORONARY ANGIOGRAPHY N/A 11/25/2016   Procedure: LEFT HEART CATH AND CORONARY ANGIOGRAPHY;  Surgeon: Runell Gess, MD;  Location: [facility];  Service: Cardiovascular;  Laterality: N/A;    MEDICATIONS AT HOME: Prior to Admission medications   Medication Sig Start Date End Date Taking? Authorizing Provider  aspirin 81 MG chewable tablet Chew 1 tablet (81 mg total) by mouth daily. 11/26/16  Yes Janetta Hora, PA-C  atorvastatin (LIPITOR) 40 MG tablet Take 1 tablet (40 mg total) by mouth daily. 11/26/16 11/26/17 Yes Janetta Hora, PA-C  hydrochlorothiazide (HYDRODIURIL) 25 MG tablet Take 1 tablet (25 mg total) by mouth daily. 11/26/16  Yes Janetta Hora, PA-C  losartan (COZAAR) 50 MG tablet Take 1 tablet (50 mg total) by mouth daily. 11/26/16  Yes Janetta Hora, PA-C  nitroGLYCERIN (NITROSTAT) 0.4 MG SL tablet Place 1 tablet (0.4 mg total) under the tongue every 5 (five) minutes x 3 doses as needed for chest pain. 11/26/16  Yes Janetta Hora, PA-C  polyvinyl alcohol (ARTIFICIAL TEARS) 1.4 % ophthalmic solution Place 1-2 drops into both eyes 3 (three) times daily as needed for dry eyes.   Yes [provider]  ticagrelor (BRILINTA) 90 MG TABS tablet Take 1 tablet (90 mg total) by mouth 2 (two) times daily. 11/26/16  Yes Janetta Hora, PA-C    Family History  Problem Relation Age of Onset  . Hypertension  Mother    Social-unmarried, lives w/ girlfriend; unemployed; nonsmoker  Objective:   Vitals:   12/06/16 1012  BP: 130/73  Pulse: 64  Resp: 18  Temp: 98.5 F (36.9 C)  TempSrc: Oral  SpO2: 97%  Weight: 182 lb (82.6 kg)  Height: 6' (1.829 m)    Exam General appearance : Awake, alert, not in any distress. Speech Clear. Not toxic looking HEENT: Atraumatic and Normocephalic, pupils equally reactive to light and accomodation Neck: supple, no JVD. No cervical lymphadenopathy.    Chest:Good air entry bilaterally, no added sounds  CVS: S1 S2 regular, no murmurs.  Abdomen: Bowel sounds present, Non tender and not distended with no guarding, rigidity or rebound. Extremities: B/L Lower Ext shows no edema, both legs are warm to touch Neurology: Awake alert, and oriented X 3, CN II-XII intact, Non focal Skin:No Rash Wounds:N/A  Data Review Lab Results  Component Value Date   HGBA1C 5.2 11/23/2016     Assessment & Plan  1. Recent NSTEMI  -Cont GDMT/DAPT (not on BB 2/2 brady)  -RF modification  -f/u with CARDS in 3 mo 2. CAD s/p recent PCI DES D1  -Cont GDMT/DAPT  -RF modification  -f/u with CARDS in 3 mo 3. HTN  -DASH diet  -Cont HCTZ, Cozaar   Return in about 4 weeks (around 01/03/2017).  The patient was given clear instructions to go to ER or return to medical center if symptoms don't improve, worsen or new problems develop. The patient verbalized understanding. The patient was told to call to get lab results if they haven't heard anything in the next week.   Total time spent with patient was 22 min. Greater than 50 % of this visit was spent face to face counseling and coordinating care regarding risk factor modification, compliance importance and encouragement, education related to CAD.  This note has been created with Education officer, environmentalDragon speech recognition software and smart phrase technology. Any transcriptional errors are unintentional.    Scot Juniffany Lilyan Prete, PA-C [facility] [place], [place] [phone]   12/06/2016, 10:25 AM

## 2016-12-11 ENCOUNTER — Telehealth: Payer: Self-pay | Admitting: General Practice

## 2016-12-11 NOTE — Telephone Encounter (Signed)
[name]. Once a week for 1 week, twice a week for 1 week, once a week for 3 weeks, and 1 visit as needed. Please f/u

## 2016-12-13 ENCOUNTER — Telehealth: Payer: Self-pay

## 2016-12-13 NOTE — Telephone Encounter (Signed)
[facility] [phone] requesting orders for the patient and to confirm [facility] will sign the [facility].  Scot Juniffany Noel, PA has seen the patient as a hospital follow up and his next appointment is 01/03/17 with Arrie SenateMandesia Hairston, FNP to establish care. Scot Juniffany Noel, PA is not in the office today.  This CM spoke to Dr Hyman HopesJegede who stated that he would  sign the orders.   [facility] today and [facility].

## 2016-12-16 NOTE — Addendum Note (Signed)
Addended byVivianne Master: Zriyah Kopplin S on: 12/16/2016 08:08 AM   Modules accepted: Orders

## 2016-12-31 ENCOUNTER — Telehealth: Payer: Self-pay | Admitting: Licensed Clinical Social Worker

## 2016-12-31 NOTE — Telephone Encounter (Signed)
LCSWA received an incoming call from pt's sister, Lenord Fellers [phone]. Ms. Logan Bores reported that pt was recently kicked out of his home and is in need of housing resources. She disclosed concerns for pt's health noting that [facility] is no longer providing home health.   LCSWA provided information on housing resources. Pt has an upcoming appointment on 01/03/17 with PCP.

## 2017-01-03 ENCOUNTER — Ambulatory Visit: Payer: Medicaid Other | Attending: Family Medicine | Admitting: Family Medicine

## 2017-01-03 ENCOUNTER — Encounter: Payer: Self-pay | Admitting: Family Medicine

## 2017-01-03 VITALS — BP 158/99 | HR 77 | Temp 97.8°F | Resp 18 | Ht 72.0 in | Wt 182.2 lb

## 2017-01-03 DIAGNOSIS — R4189 Other symptoms and signs involving cognitive functions and awareness: Secondary | ICD-10-CM

## 2017-01-03 DIAGNOSIS — Z79899 Other long term (current) drug therapy: Secondary | ICD-10-CM | POA: Insufficient documentation

## 2017-01-03 DIAGNOSIS — R4689 Other symptoms and signs involving appearance and behavior: Secondary | ICD-10-CM | POA: Diagnosis not present

## 2017-01-03 DIAGNOSIS — I2510 Atherosclerotic heart disease of native coronary artery without angina pectoris: Secondary | ICD-10-CM | POA: Diagnosis not present

## 2017-01-03 DIAGNOSIS — I10 Essential (primary) hypertension: Secondary | ICD-10-CM | POA: Insufficient documentation

## 2017-01-03 DIAGNOSIS — Z7982 Long term (current) use of aspirin: Secondary | ICD-10-CM | POA: Insufficient documentation

## 2017-01-03 DIAGNOSIS — Z87820 Personal history of traumatic brain injury: Secondary | ICD-10-CM | POA: Diagnosis not present

## 2017-01-03 DIAGNOSIS — Z590 Homelessness: Secondary | ICD-10-CM | POA: Diagnosis not present

## 2017-01-03 DIAGNOSIS — I252 Old myocardial infarction: Secondary | ICD-10-CM | POA: Diagnosis not present

## 2017-01-03 NOTE — Progress Notes (Signed)
Subjective:  Patient ID: Cameron West, male    DOB: 1955-07-02  Age: 61 y.o. MRN: [id number]  CC: Hypertension   HPI PRATHIK AMAN presents to establish care. He is accompanied by his foster sister. History of NSTEMI 11/2016 , HTN, CAD. History of PCI. He reports adherence with DAPT.  He is not exercising and is adherent to low salt diet.  He does not check BP at home. He reports running late for office visit and forgetting to take hypertensive medications. Cardiac symptoms none. Patient denies chest pain, chest pressure/discomfort, claudication, dyspnea, lower extremity edema, near-syncope, orthopnea, palpitations and syncope.  Cardiovascular risk factors: dyslipidemia, hypertension, male gender, sedentary lifestyle and smoking/ tobacco exposure. Use of agents associated with hypertension: none. History of target organ damage: angina/ prior myocardial infarction. History of TBI. He reports 3 years ago being stuck by a metal object at work to the left temporal region. He denies reporting situation. His foster sister reports patient has issues with memory and being able to complete tasks. Social situation is complex. He has one teenage son, and a girlfriend who recently kicked him out of the home. He is currently homeless and is staying with foster relatives and friends.   Outpatient Medications Prior to Visit  Medication Sig Dispense Refill  . aspirin 81 MG chewable tablet Chew 1 tablet (81 mg total) by mouth daily.    Marland Kitchen atorvastatin (LIPITOR) 40 MG tablet Take 1 tablet (40 mg total) by mouth daily. 90 tablet 2  . hydrochlorothiazide (HYDRODIURIL) 25 MG tablet Take 1 tablet (25 mg total) by mouth daily. 90 tablet 2  . losartan (COZAAR) 50 MG tablet Take 1 tablet (50 mg total) by mouth daily. 90 tablet 2  . nitroGLYCERIN (NITROSTAT) 0.4 MG SL tablet Place 1 tablet (0.4 mg total) under the tongue every 5 (five) minutes x 3 doses as needed for chest pain. 25 tablet 12  . polyvinyl alcohol (ARTIFICIAL  TEARS) 1.4 % ophthalmic solution Place 1-2 drops into both eyes 3 (three) times daily as needed for dry eyes.    . ticagrelor (BRILINTA) 90 MG TABS tablet Take 1 tablet (90 mg total) by mouth 2 (two) times daily. 180 tablet 4   No facility-administered medications prior to visit.     ROS Review of Systems  Constitutional: Negative.   Eyes: Negative.   Respiratory: Negative.   Cardiovascular: Negative.   Gastrointestinal: Negative.   Neurological: Negative for seizures.  Psychiatric/Behavioral: Positive for behavioral problems, confusion and decreased concentration. Negative for suicidal ideas.    Objective:  BP (!) 158/99 (BP Location: Left Arm, Patient Position: Sitting, Cuff Size: Normal)   Pulse 77   Temp 97.8 F (36.6 C) (Oral)   Resp 18   Ht 6' (1.829 m)   Wt 182 lb 3.2 oz (82.6 kg)   SpO2 96%   BMI 24.71 kg/m   BP/Weight 01/03/2017 12/06/2016 12/03/2016  Systolic BP 158 130 130  Diastolic BP 99 73 74  Wt. (Lbs) 182.2 182 176.12  BMI 24.71 24.68 21.44     Physical Exam  Constitutional: He appears well-developed and well-nourished.  HENT:  Head: Normocephalic and atraumatic.  Right Ear: External ear normal.  Left Ear: External ear normal.  Nose: Nose normal.  Mouth/Throat: Oropharynx is clear and moist.  Eyes: Pupils are equal, round, and reactive to light. Conjunctivae are normal.  Neck: No JVD present.  Cardiovascular: Normal rate, regular rhythm, normal heart sounds and intact distal pulses.   Pulmonary/Chest: Effort normal  and breath sounds normal.  Abdominal: Soft. Bowel sounds are normal. There is no tenderness.  Neurological: He is alert. He has normal reflexes. Coordination abnormal.  Skin: Skin is warm and dry.  Psychiatric: He is slowed. Cognition and memory are impaired. He expresses no homicidal and no suicidal ideation. He expresses no suicidal plans and no homicidal plans.  Nursing note and vitals reviewed.    Assessment & Plan:   1. Cognitive  and behavioral changes Mini Mental Status Examination provided. LCSW spoke with patient and foster relative regarding resources. - Basic metabolic panel - CBC - Vitamin B12 - TSH - Ambulatory referral to Neurology  2. Essential hypertension  Schedule BP recheck in 1 weeks with nurse.   3. History of non-ST elevation myocardial infarction (NSTEMI)   4. Coronary artery disease involving native coronary artery of native heart without angina pectoris   5. History of traumatic brain injury  - Ambulatory referral to Neurology   No orders of the defined types were placed in this encounter.   Follow-up: Return in about 1 week (around 01/10/2017) for BP check with Travia.   Lizbeth Bark FNP

## 2017-01-03 NOTE — Patient Instructions (Signed)

## 2017-01-03 NOTE — Progress Notes (Signed)
Patient is here for f/up  

## 2017-01-04 LAB — BASIC METABOLIC PANEL
BUN/Creatinine Ratio: 26 — ABNORMAL HIGH (ref 9–20)
BUN: 19 mg/dL (ref 6–24)
CO2: 26 mmol/L (ref 20–29)
CREATININE: 0.73 mg/dL — AB (ref 0.76–1.27)
Calcium: 10.2 mg/dL (ref 8.7–10.2)
Chloride: 102 mmol/L (ref 96–106)
GFR calc Af Amer: 117 mL/min/{1.73_m2} (ref >59)
GFR calc non Af Amer: 102 mL/min/{1.73_m2} (ref >59)
GLUCOSE: 112 mg/dL — AB (ref 65–99)
Potassium: 3.9 mmol/L (ref 3.5–5.2)
Sodium: 145 mmol/L — ABNORMAL HIGH (ref 134–144)

## 2017-01-04 LAB — CBC
HEMATOCRIT: 42.4 % (ref 37.5–51.0)
Hemoglobin: 13.8 g/dL (ref 13.0–17.7)
MCH: 29.7 pg (ref 26.6–33.0)
MCHC: 32.5 g/dL (ref 31.5–35.7)
MCV: 91 fL (ref 79–97)
PLATELETS: 271 10*3/uL (ref 150–379)
RBC: 4.65 x10E6/uL (ref 4.14–5.80)
RDW: 14.5 % (ref 12.3–15.4)
WBC: 4.8 10*3/uL (ref 3.4–10.8)

## 2017-01-04 LAB — TSH: TSH: 0.752 u[IU]/mL (ref 0.450–4.500)

## 2017-01-04 LAB — VITAMIN B12: VITAMIN B 12: 513 pg/mL (ref 232–1245)

## 2017-01-07 ENCOUNTER — Telehealth: Payer: Self-pay

## 2017-01-07 NOTE — Telephone Encounter (Signed)
CMA call regarding lab results   Patient did not answer but left a VM stating the reason of the call & to call back  

## 2017-01-07 NOTE — Telephone Encounter (Signed)
-----   Message from Lizbeth Bark, [place] sent at 01/06/2017  6:41 PM EDT ----- Thyroid function normal when abnormal it can cause neurological symptoms. When vitamin B12 is low it can cause neurological symptoms.  Labs that evaluated your blood cells are normal. No signs of anemia, acute infection, or inflammation present. Kidney function normal Labs normal.

## 2017-01-10 ENCOUNTER — Other Ambulatory Visit: Payer: Self-pay | Admitting: Family Medicine

## 2017-01-10 ENCOUNTER — Telehealth: Payer: Self-pay | Admitting: *Deleted

## 2017-01-10 ENCOUNTER — Ambulatory Visit: Payer: Medicaid Other | Attending: Family Medicine | Admitting: *Deleted

## 2017-01-10 VITALS — BP 124/80 | HR 65 | Resp 16

## 2017-01-10 DIAGNOSIS — I10 Essential (primary) hypertension: Secondary | ICD-10-CM

## 2017-01-10 DIAGNOSIS — L602 Onychogryphosis: Secondary | ICD-10-CM

## 2017-01-10 DIAGNOSIS — Z0131 Encounter for examination of blood pressure with abnormal findings: Secondary | ICD-10-CM | POA: Diagnosis not present

## 2017-01-10 NOTE — Telephone Encounter (Signed)
Request made for patient to have Podiatry referral. Unable to clip toenails due to thickness.

## 2017-01-10 NOTE — Telephone Encounter (Signed)
Will place referral. Patient need to apply and complete application  for orange card to complete referral process.

## 2017-01-10 NOTE — Telephone Encounter (Signed)
Please inform patient: Referral will be placed to Podiatry ( foot doctor).  Patient needs to apply and complete application  for orange card to complete referral process. In the meantime he can make use of the  Podiatry clinic as informed. Next clinic is on Monday, offering to 1st  Ten patients.

## 2017-01-10 NOTE — Progress Notes (Signed)
Pt arrived to [facility], alert and oriented, NAD. Cameron West arrives in good spirits with sister. Pt denies chest pain, SOB, HA, dizziness, or blurred vision.  Verified medication with patient. Pt states medication was taken this morning.  Manual blood pressure reading: 130/80 and 124/80

## 2017-01-13 NOTE — Telephone Encounter (Signed)
Attempt to inform patient about referral on 10/05 and 10/08. Left message on voicemail to return call.

## 2017-01-14 ENCOUNTER — Other Ambulatory Visit: Payer: Medicaid Other

## 2017-01-14 DIAGNOSIS — E785 Hyperlipidemia, unspecified: Secondary | ICD-10-CM

## 2017-01-14 LAB — LIPID PANEL
CHOL/HDL RATIO: 2.7 ratio (ref 0.0–5.0)
Cholesterol, Total: 85 mg/dL — ABNORMAL LOW (ref 100–199)
HDL: 32 mg/dL — AB (ref >39)
LDL CALC: 42 mg/dL (ref 0–99)
Triglycerides: 57 mg/dL (ref 0–149)
VLDL Cholesterol Cal: 11 mg/dL (ref 5–40)

## 2017-01-14 LAB — HEPATIC FUNCTION PANEL
ALT: 39 IU/L (ref 0–44)
AST: 30 IU/L (ref 0–40)
Albumin: 4.8 g/dL (ref 3.5–5.5)
Alkaline Phosphatase: 73 IU/L (ref 39–117)
BILIRUBIN, DIRECT: 0.15 mg/dL (ref 0.00–0.40)
Bilirubin Total: 0.4 mg/dL (ref 0.0–1.2)
TOTAL PROTEIN: 7.8 g/dL (ref 6.0–8.5)

## 2017-01-15 ENCOUNTER — Telehealth: Payer: Self-pay

## 2017-01-15 NOTE — Telephone Encounter (Signed)
-----   Message from Beatrice Lecher, PA-C sent at 01/15/2017 11:36 AM EDT ----- Please call patient. The LDL is at goal.   The liver enzyme tests are normal. Continue with current treatment plan. Tereso Newcomer, PA-C 01/15/2017 11:36 AM  01/15/2017 11:36 AM

## 2017-01-15 NOTE — Telephone Encounter (Signed)
Spoke with patient and gave lab results with verbal understanding. He also gave verbal permission to speak with his sister Marylene Land.

## 2017-02-11 ENCOUNTER — Ambulatory Visit: Payer: Medicaid Other | Admitting: Cardiovascular Disease

## 2017-03-06 ENCOUNTER — Encounter: Payer: Self-pay | Admitting: Family Medicine

## 2017-03-06 ENCOUNTER — Ambulatory Visit: Payer: Medicaid Other | Attending: Family Medicine | Admitting: Family Medicine

## 2017-03-06 VITALS — BP 168/83 | HR 68 | Temp 98.2°F | Resp 16 | Ht 72.0 in | Wt 186.0 lb

## 2017-03-06 DIAGNOSIS — Z79899 Other long term (current) drug therapy: Secondary | ICD-10-CM | POA: Insufficient documentation

## 2017-03-06 DIAGNOSIS — I252 Old myocardial infarction: Secondary | ICD-10-CM | POA: Insufficient documentation

## 2017-03-06 DIAGNOSIS — R4189 Other symptoms and signs involving cognitive functions and awareness: Secondary | ICD-10-CM | POA: Diagnosis not present

## 2017-03-06 DIAGNOSIS — R4689 Other symptoms and signs involving appearance and behavior: Secondary | ICD-10-CM | POA: Diagnosis not present

## 2017-03-06 DIAGNOSIS — Z87820 Personal history of traumatic brain injury: Secondary | ICD-10-CM | POA: Insufficient documentation

## 2017-03-06 DIAGNOSIS — E785 Hyperlipidemia, unspecified: Secondary | ICD-10-CM | POA: Insufficient documentation

## 2017-03-06 DIAGNOSIS — Z7982 Long term (current) use of aspirin: Secondary | ICD-10-CM | POA: Diagnosis not present

## 2017-03-06 DIAGNOSIS — I2510 Atherosclerotic heart disease of native coronary artery without angina pectoris: Secondary | ICD-10-CM | POA: Insufficient documentation

## 2017-03-06 DIAGNOSIS — I10 Essential (primary) hypertension: Secondary | ICD-10-CM | POA: Insufficient documentation

## 2017-03-06 MED ORDER — LOSARTAN POTASSIUM 100 MG PO TABS: 100.0000 mg | ORAL_TABLET | Freq: Every day | ORAL | 0 refills | Status: DC

## 2017-03-06 MED ORDER — HYDROCHLOROTHIAZIDE 25 MG PO TABS: 25.0000 mg | ORAL_TABLET | Freq: Every day | ORAL | 0 refills | Status: DC

## 2017-03-06 NOTE — Progress Notes (Signed)
Subjective:  Patient ID: Cameron West, male    DOB: 08-09-1955  Age: 61 y.o. MRN: [id number]  CC: Establish Care   HPI Cameron SpellerLee A Verbrugge presents to establish care. He is accompanied eldest son. History of NSTEMI 11/2016 , HTN, CAD. History of PCI. He reports adherence with DAPT.  He is not exercising and is adherent to low salt diet.  He does not check BP at home. Cardiac symptoms none. Patient denies chest pain, chest pressure/discomfort, claudication, dyspnea, lower extremity edema, near-syncope, orthopnea, palpitations and syncope.  Cardiovascular risk factors: dyslipidemia, hypertension, male gender, sedentary lifestyle and smoking/ tobacco exposure. Use of agents associated with hypertension: none. History of target organ damage: angina/ prior myocardial infarction. History of TBI. He has reported 3 years ago being stuck by a metal object at work to the left temporal region. He denies reporting situation.  His eldest son reports  patient has issues with memory and being able to complete tasks. Social situation is complex. He has one teenage son age 59 years old,  and former girlfriend in her home.  Patient's eldest son reports younger son whose lives at home has to remind patient to take his medications twice a day, but may forget to.  Outpatient Medications Prior to Visit  Medication Sig Dispense Refill  . aspirin 81 MG chewable tablet Chew 1 tablet (81 mg total) by mouth daily.    Marland Kitchen. atorvastatin (LIPITOR) 40 MG tablet Take 1 tablet (40 mg total) by mouth daily. 90 tablet 2  . nitroGLYCERIN (NITROSTAT) 0.4 MG SL tablet Place 1 tablet (0.4 mg total) under the tongue every 5 (five) minutes x 3 doses as needed for chest pain. 25 tablet 12  . ticagrelor (BRILINTA) 90 MG TABS tablet Take 1 tablet (90 mg total) by mouth 2 (two) times daily. 180 tablet 4  . hydrochlorothiazide (HYDRODIURIL) 25 MG tablet Take 1 tablet (25 mg total) by mouth daily. 90 tablet 2  . losartan (COZAAR) 50 MG tablet Take 1 tablet  (50 mg total) by mouth daily. 90 tablet 2  . polyvinyl alcohol (ARTIFICIAL TEARS) 1.4 % ophthalmic solution Place 1-2 drops into both eyes 3 (three) times daily as needed for dry eyes.     No facility-administered medications prior to visit.     ROS Review of Systems  Constitutional: Negative.   Eyes: Negative.   Respiratory: Negative.   Cardiovascular: Negative.   Gastrointestinal: Negative.   Neurological: Negative for seizures.  Psychiatric/Behavioral: Positive for behavioral problems, confusion and decreased concentration. Negative for suicidal ideas.    Objective:  BP (!) 168/83 (BP Location: Right Arm, Cuff Size: Normal)   Pulse 68   Temp 98.2 F (36.8 C) (Oral)   Resp 16   Ht 6' (1.829 m)   Wt 186 lb (84.4 kg)   SpO2 100%   BMI 25.23 kg/m   BP/Weight 03/06/2017 01/10/2017 01/03/2017  Systolic BP 168 124 158  Diastolic BP 83 80 99  Wt. (Lbs) 186 - 182.2  BMI 25.23 - 24.71   Physical Exam  Constitutional: He appears well-developed and well-nourished.  Cardiovascular: Normal rate, regular rhythm, normal heart sounds and intact distal pulses.  Pulmonary/Chest: Effort normal and breath sounds normal.  Abdominal: Soft. Bowel sounds are normal.  Neurological: He is alert.  Skin: Skin is warm and dry.  Psychiatric: He is slowed. Cognition and memory are impaired. He expresses no homicidal and no suicidal ideation. He expresses no suicidal plans and no homicidal plans.  Nursing note and  vitals reviewed.   Assessment & Plan:   1. Cognitive and behavioral changes Scored of 14 on  Mini-Mental status exam.  Referral to home health for medication management. - Ambulatory referral to Neurology - Ambulatory referral to Home Health  2. Essential hypertension  - hydrochlorothiazide (HYDRODIURIL) 25 MG tablet; Take 1 tablet (25 mg total) by mouth daily.  Dispense: 90 tablet; Refill: 0 - losartan (COZAAR) 100 MG tablet; Take 1 tablet (100 mg total) by mouth daily.  Dispense:  90 tablet; Refill: 0  3. History of traumatic brain injury  - Ambulatory referral to Home Health    Follow-up: Return in about 3 months (around 06/05/2017) for HTN.   Lizbeth BarkMandesia R Sakeenah Valcarcel FNP

## 2017-03-06 NOTE — Patient Instructions (Signed)

## 2017-03-13 ENCOUNTER — Telehealth: Payer: Self-pay | Admitting: Family Medicine

## 2017-03-13 NOTE — Telephone Encounter (Signed)
Call placed to patient #[phone], to inform him that we had received a referral from provider for home health nursing. Pt didn't understand at the beginning but I was able to explain to him. Confirmed with patient if he wanted to use [facility] as his agency of choice and patient said "yes". Patient had used agency in the past. Patient stated that if he had to pay anything he wouldn't be interested any more. Explained to patient that his information would be sent to [facility] and they will evaluate the documents and check eligibility with his insurance before providing any services. They also needs his consent. Patient understood and had no further questions.

## 2017-03-13 NOTE — Telephone Encounter (Signed)
Faxed referral and patient's document to [facility] # [phone] (fax).

## 2017-03-14 ENCOUNTER — Telehealth: Payer: Self-pay | Admitting: Family Medicine

## 2017-03-14 NOTE — Telephone Encounter (Signed)
[facility] called to say they are stating on December 13.

## 2017-03-14 NOTE — Telephone Encounter (Signed)
Call placed to [facility] to check on the status of the referral. Spoke with Amy and she informed that it was received. She mentioned that due to staffing the start date has been pushed to 12/13 because of staffing. [facility] called the office to inform provider of start date and confirm if that was ok. They were give the OK by [place].    Mentioned to Amy that patient was concerned about co pays and payments. Amy stated that she will have someone from accounting reach out to patient ((before start date) to give him for information regarding that matter.

## 2017-04-21 ENCOUNTER — Ambulatory Visit: Payer: Medicaid Other | Admitting: Neurology

## 2017-04-21 ENCOUNTER — Encounter: Payer: Self-pay | Admitting: Neurology

## 2017-04-21 VITALS — BP 151/98 | HR 72 | Ht 72.0 in | Wt 192.0 lb

## 2017-04-21 DIAGNOSIS — R413 Other amnesia: Secondary | ICD-10-CM | POA: Diagnosis not present

## 2017-04-21 DIAGNOSIS — G309 Alzheimer's disease, unspecified: Secondary | ICD-10-CM | POA: Diagnosis not present

## 2017-04-21 NOTE — Progress Notes (Addendum)
Subjective:    Patient ID: Cameron West is a 62 y.o. male.  HPI     Huston FoleySaima Janeliz Prestwood, MD, PhD [facility] [address], [address] [address] [place], [place] [address]  Dear Cameron ReevesMandesia,   I saw your patient, Cameron BurtonLee Mcauley, upon your kind request, in my neurologic clinic today for initial consultation of his memory loss. The patient is accompanied by his son, Cameron Curenthony Jr., today. As you know, Mr. Cameron West is a 62 year old right-handed gentleman with an underlying medical history of hypertension, coronary artery disease, status post non-STEMI in August 2018, hyperlipidemia, prior smoking with recent cessation, and borderline overweight state, who has had memory loss for the past 3+ years. He lives with another son and a friend. I reviewed your office note from 03/06/2017. At that time his MMSE was 14 out of 30. He has never had a brain scan. His ex-girlfriend was involved to some degree. In the recent past, he has had some anger issues. He has worked last time some 3 years ago. 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 He was not checked out at the time. As I understand, he did not lose consciousness. There was no residual metal in his scalp or head as I understand. He has not been driving for about 1 year. He quit smoking after his coronary stent placement in 8/18, but has not had a FU appt with cardiology as planned for Nov. 2018. Patient has not been driving for at least a year or perhaps 3 years. As I understand he had a speeding ticket some 3 years ago shortly after he quit working and forgot to pay for speeding ticket. He never renewed his license. He was a smoker for over 40 years per son. He lives with his 60 year old son, Cameron West, and a friend. He's had issues with housing. He does not currently drink any alcohol and does not really drink water. He likes to drink  sodas and juice. There is no family history of dementia. He has another son who lives in [place]. He had blood work in the recent few months including vitamin B12 level and TSH, both were in the normal range. His lipid profile in August 2018 was normal.  He has a 10th gr education.   His Past Medical History Is Significant For: Past Medical History:  Diagnosis Date  . CAD (coronary artery disease)    a. 11/2016: NSTEMI: 70% mid-dist RCA stenosis with vasospasm resolving with intracoronary NTG and 95% ost D1 s/p DES  . Hypertension   . Tobacco abuse   . Traumatic brain injury [facility](HCC)    a. per son report    His Past Surgical History Is Significant For: Past Surgical History:  Procedure Laterality Date  . CORONARY STENT INTERVENTION N/A 11/25/2016   Procedure: CORONARY STENT INTERVENTION;  Surgeon: Runell GessBerry, Jonathan J, MD;  Location: [facility] INVASIVE CV LAB;  Service: Cardiovascular;  Laterality: N/A;  . LEFT HEART CATH AND CORONARY ANGIOGRAPHY N/A 11/25/2016   Procedure: LEFT HEART CATH AND CORONARY ANGIOGRAPHY;  Surgeon: Runell GessBerry, Jonathan J, MD;  Location: [facility] INVASIVE CV LAB;  Service: Cardiovascular;  Laterality: N/A;    His Family History Is Significant For: Family History  Problem Relation Age of Onset  . Hypertension Mother     His Social History Is Significant For: Social History  Socioeconomic History  . Marital status: Single    Spouse name: None  . Number of children: None  . Years of education: None  . Highest education level: None  Social Needs  . Financial resource strain: None  . Food insecurity - worry: None  . Food insecurity - inability: None  . Transportation needs - medical: None  . Transportation needs - non-medical: None  Occupational History  . None  Tobacco Use  . Smoking status: Current Some Day Smoker  . Smokeless tobacco: Never Used  Substance and Sexual Activity  . Alcohol use: No  . Drug use: No  . Sexual activity: None  Other Topics Concern  .  None  Social History Narrative  . None    His Allergies Are:  No Known Allergies:   His Current Medications Are:  Outpatient Encounter Medications as of 04/21/2017  Medication Sig  . aspirin 81 MG chewable tablet Chew 1 tablet (81 mg total) by mouth daily.  Marland Kitchen atorvastatin (LIPITOR) 40 MG tablet Take 1 tablet (40 mg total) by mouth daily.  . hydrochlorothiazide (HYDRODIURIL) 25 MG tablet Take 1 tablet (25 mg total) by mouth daily.  Marland Kitchen losartan (COZAAR) 50 MG tablet Take 50 mg by mouth daily.  . nitroGLYCERIN (NITROSTAT) 0.4 MG SL tablet Place 1 tablet (0.4 mg total) under the tongue every 5 (five) minutes x 3 doses as needed for chest pain.  . polyvinyl alcohol (ARTIFICIAL TEARS) 1.4 % ophthalmic solution Place 1-2 drops into both eyes 3 (three) times daily as needed for dry eyes.  . ticagrelor (BRILINTA) 90 MG TABS tablet Take 1 tablet (90 mg total) by mouth 2 (two) times daily.  . [DISCONTINUED] losartan (COZAAR) 100 MG tablet Take 1 tablet (100 mg total) by mouth daily.   No facility-administered encounter medications on file as of 04/21/2017.   : Review of Systems:  Out of a complete 14 point review of systems, all are reviewed and negative with the exception of these symptoms as listed below:  Review of Systems  Neurological:       Pt presents today to discuss his memory. Pt and family have noticed a progressive memory loss over the past 3 years. Pt lives with another son.    Objective:  Neurological Exam  Physical Exam Physical Examination:   Vitals:   04/21/17 0956  BP: (!) 151/98  Pulse: 72    General Examination: The patient is a very pleasant 62 y.o. male in no acute distress. He appears well-developed and well-nourished and adequately groomed.   HEENT: Normocephalic, atraumatic, pupils are equal, round and reactive to light and accommodation. Funduscopic exam is normal with sharp disc margins noted. Extraocular tracking is good without limitation to gaze excursion  or nystagmus noted. Normal smooth pursuit is noted. Hearing is grossly intact. Tympanic membranes are clear bilaterally. Face is symmetric with normal facial animation and normal facial sensation. Speech is clear with no dysarthria noted. There is no hypophonia. There is no lip, neck/head, jaw or voice tremor. Neck is supple with full range of passive and active motion. There are no carotid bruits on auscultation. Oropharynx exam reveals: moderate mouth dryness, adequate to marginal dental hygiene and moderate airway crowding, due to smaller airway entry and redundant soft palate. Tongue protrudes centrally and palate elevates symmetrically.  Chest: Clear to auscultation without wheezing, rhonchi or crackles noted.  Heart: S1+S2+0, regular and normal without murmurs, rubs or gallops noted.   Abdomen: Soft, non-tender and non-distended with normal bowel sounds  appreciated on auscultation.  Extremities: There is no pitting edema in the distal lower extremities bilaterally. Pedal pulses are intact.  Skin: Warm and dry without trophic changes noted.  Musculoskeletal: exam reveals no obvious joint deformities, tenderness or joint swelling or erythema.   Neurologically:  Mental status: The patient is awake, alert and oriented in all 4 spheres. His immediate and remote memory, attention, language skills and fund of knowledge are impaired. His son provides most of the history. Speech is clear with normal prosody and enunciation. Thought process is linear. Mood is normal and affect is normal.   On 04/21/2017: MMSE: 8/30, CDT: 1/4, AFT: 5/min.  Cranial nerves II - XII are as described above under HEENT exam. In addition: shoulder shrug is normal with equal shoulder height noted. Motor exam: Normal bulk, strength and tone is noted. There is no drift, tremor or rebound. Romberg is negative. Reflexes are 1+ throughout. Fine motor skills and coordination: grossly intact.  Cerebellar testing: No dysmetria or  intention tremor. There is no truncal or gait ataxia, tandem walk is difficult for him.  Sensory exam: intact to light touch in the upper and lower extremities.  Gait, station and balance: He stands easily. No veering to one side is noted. No leaning to one side is noted. Posture is age-appropriate and stance is narrow based. Gait shows normal stride length and normal pace. No problems turning are noted.   Assessment and Plan:   In summary, KEILON RESSEL is a very pleasant 62 y.o.-year old male  with an underlying medical history of hypertension, coronary artery disease, status post non-STEMI in August 2018, hyperlipidemia, prior smoking with recent cessation, and borderline overweight state, who presents for neurologic consultation of his memory loss. His memory scores are abnormal. He may have had a decline in the past few months even. He has vascular risk factors and is at risk for vascular dementia. He is advised to proceed with further evaluation. He and his son are advised to make sure he has a healthy diet, increase his water intake and decrease his his soda intake. We talked about maintaining a healthy lifestyle in general. We talked about vascular risk factors. I would like to proceed with a brain MRI without contrast to rule out another cause of his memory issue. I would like to proceed with formal neuropsychological testing as well especially given his history of anger issues in the recent past. These have improved with time her son's report. He does not endorse any anxiety or depression and his son has not noticed any issues in that regard, he is advised that this will need to be monitored. In addition, patient is advised to reschedule his follow-up appointment with cardiology which he may have missed in November 2018. I suggested a 3 month follow-up with me. Based on his MRI results, I would like to initiate medication, most likely in the form of Aricept generic. We will keep them posted as to his  test results, we will likely call Ethelene Browns with his test results.  The patient is advised to adhere to some form of daily exercise. He has an elliptical machine at the house.  I answered all their questions today and the patient and Ethelene Browns were in agreement with the above outlined plan. Thank you very much for allowing me to participate in the care of this nice patient. If I can be of any further assistance to you please do not hesitate to call me at [phone].  Sincerely,  Star Age, MD, PhD

## 2017-04-21 NOTE — Patient Instructions (Addendum)
For your memory loss, we will do further testing: We will do a brain scan, called MRI and call you with the test results. We will have to schedule you for this on a separate date. This test requires authorization from your insurance, and we will take care of the insurance process.  You have complaints of memory loss: memory loss or changes in cognitive function can have many reasons and does not always mean you have dementia. Conditions that can contribute to subjective or objective memory loss include: depression, stress, poor sleep from insomnia or sleep apnea, dehydration, fluctuation in blood sugar values, thyroid or electrolyte dysfunction and certain vitamin deficiencies. Dementia can be caused by stroke, brain atherosclerosis or brain vascular disease due to vascular risk factors (smoking, high blood pressure, high cholesterol, obesity and uncontrolled diabetes), certain degenerative brain disorders (including Parkinson's disease and Multiple sclerosis) and by Alzheimer's disease or other, more rare and sometimes hereditary causes. We will do some additional testing: blood work (which has been done recently already) and we will do a brain scan. We will not start medication as yet, but will likely start medicine after the brain MRI results are available.   We will also request a formal cognitive test called neuropsychological evaluation which is done by a licensed neuropsychologist. We will make a referral in that regard. We will call you with brain scan test results and monitor your symptoms. Your memory loss seems to be progressive. It is important to have as much data and tests, as possible. We will monitor your memory scores.   Please drink more water, eat a healthy, balanced diet, exercise daily, if possible.   Please call Dr. Harvie BridgeNahser's office for a follow up in cardiology. You saw Tereso NewcomerScott Weaver, PA on 12/03/16.

## 2017-04-28 ENCOUNTER — Encounter: Payer: Self-pay | Admitting: Psychology

## 2017-05-03 ENCOUNTER — Ambulatory Visit
Admission: RE | Admit: 2017-05-03 | Discharge: 2017-05-03 | Disposition: A | Discharge status: Home / Self Care | Payer: Medicaid Other | Source: Ambulatory Visit | Attending: Neurology | Admitting: Neurology

## 2017-05-03 DIAGNOSIS — R413 Other amnesia: Secondary | ICD-10-CM

## 2017-05-05 NOTE — Progress Notes (Signed)
Please call patient regarding the recent brain MRI: The brain scan showed a normal structure of the brain and no significant amount of volume loss or what we call atrophy. There were changes in the deeper structures of the brain, which we call white matter changes or microvascular changes. These were reported as more advanced for age in his case. These are tiny white spots, that occur with time and are seen in a variety of conditions, including with normal aging, chronic hypertension, chronic headaches, especially migraine HAs, chronic diabetes, chronic hyperlipidemia. These are not strokes and no mass or lesion or contrast enhancement was seen which is reassuring. Again, there were no acute findings, such as a stroke, or mass or blood products. No further action is required on this test at this time, other than re-enforcing the importance of good blood pressure control, good cholesterol control, good blood sugar control, and weight management. Please remind patient to keep any upcoming appointments or tests and to call us with any interim questions, concerns, problems or updates. Thanks,  He is scheduled for neuropsych appts in June.  Huston FoleySaima Shakiara Lukic, MD, PhD

## 2017-05-06 ENCOUNTER — Telehealth: Payer: Self-pay

## 2017-05-06 NOTE — Telephone Encounter (Signed)
I called pt's son to discuss pt's MRI results. No answer, left a message asking him to call me back.

## 2017-05-06 NOTE — Telephone Encounter (Signed)
-----   Message from Huston FoleySaima Athar, MD sent at 05/05/2017 12:08 PM EST ----- Please call patient regarding the recent brain MRI: The brain scan showed a normal structure of the brain and no significant amount of volume loss or what we call atrophy. There were changes in the deeper structures of the brain, which we call white matter changes or microvascular changes. These were reported as more advanced for age in his case. These are tiny white spots, that occur with time and are seen in a variety of conditions, including with normal aging, chronic hypertension, chronic headaches, especially migraine HAs, chronic diabetes, chronic hyperlipidemia. These are not strokes and no mass or lesion or contrast enhancement was seen which is reassuring. Again, there were no acute findings, such as a stroke, or mass or blood products. No further action is required on this test at this time, other than re-enforcing the importance of good blood pressure control, good cholesterol control, good blood sugar control, and weight management. Please remind patient to keep any upcoming appointments or tests and to call us with any interim questions, concerns, problems or updates. Thanks,  He is scheduled for neuropsych appts in June.  Huston FoleySaima Athar, MD, PhD

## 2017-05-15 ENCOUNTER — Ambulatory Visit: Payer: Medicaid Other | Admitting: Neurology

## 2017-05-15 NOTE — Telephone Encounter (Signed)
I called pt's son on [employer], to discuss MRI results. No answer, left a message asking him to call me back.

## 2017-05-16 NOTE — Telephone Encounter (Signed)
I called pt's friend, Jen Moweresa Murray, [facility]. No answer, left a message asking her to call me back. This is my third attempt at reaching pt and caregivers. Will send pt a letter asking him to call me back.

## 2017-05-19 NOTE — Telephone Encounter (Signed)
I called again, left a message at pt's home number listed, asking pt to call me back. I will send a letter since I have still been unsuccessful at reaching pt or caregivers by phone.

## 2017-06-09 ENCOUNTER — Encounter: Payer: Self-pay | Admitting: Neurology

## 2017-07-28 ENCOUNTER — Ambulatory Visit: Payer: Medicaid Other | Admitting: Neurology

## 2017-09-08 ENCOUNTER — Encounter: Payer: Self-pay | Admitting: Psychology

## 2017-09-09 ENCOUNTER — Encounter: Payer: Medicaid Other | Admitting: Psychology

## 2017-10-01 ENCOUNTER — Ambulatory Visit: Payer: Medicaid Other | Admitting: Neurology

## 2017-10-02 ENCOUNTER — Encounter: Payer: Medicaid Other | Admitting: Psychology

## 2017-11-20 ENCOUNTER — Ambulatory Visit: Payer: Medicaid Other | Admitting: Neurology

## 2017-11-20 ENCOUNTER — Encounter: Payer: Self-pay | Admitting: Neurology

## 2017-11-20 VITALS — BP 160/100 | HR 64 | Ht 72.0 in | Wt 179.0 lb

## 2017-11-20 DIAGNOSIS — F0390 Unspecified dementia without behavioral disturbance: Secondary | ICD-10-CM

## 2017-11-20 DIAGNOSIS — F03C0 Unspecified dementia, severe, without behavioral disturbance, psychotic disturbance, mood disturbance, and anxiety: Secondary | ICD-10-CM

## 2017-11-20 MED ORDER — MEMANTINE HCL 10 MG PO TABS: 10.0000 mg | ORAL_TABLET | Freq: Two times a day (BID) | ORAL | 5 refills | Status: DC

## 2017-11-20 NOTE — Patient Instructions (Addendum)
I think for your overall wellbeing and safety, it is imperative that you have full supervision and a structured and safe environment, and that you take your medication on a regular basis. Please discuss with your family the possibility transitioning to a memory care facility.   I would like to suggest starting you on a memory medication, called Namenda (memantine) 10 mg once daily for the next 2 weeks, then 2 times a day thereafter. Side effects include: nausea, confusion, hallucination, personality changes. If you are having mild side effects, try to stick with the treatment as these initial side effects may go away after the first 10-14 days.     Please keep your appointment with Dr. Dimas ChyleBailar-Heath in November.

## 2017-11-20 NOTE — Progress Notes (Signed)
Subjective:    Patient ID: Cameron West is a 62 y.o. male.  HPI     Interim history:   Cameron West is a 62 year old right-handed gentleman with an underlying medical history of hypertension, coronary artery disease, status post non-STEMI in August 2018, hyperlipidemia, prior smoking with recent cessation, and borderline overweight state, who presents for follow-up consultation of his memory loss. The patient is accompanied by his daughter-in-law, Cameron West, today. I first met him on 04/21/2017 at the request of his primary care provider, at which time he was reported by his oldest son, Cameron West) to have at least 3 year history of memory loss. In November 2018 at primary care office his MMSE was 14 out of 30. His MMSE in our office in January 2019 was 8 out of 30. He has had recent blood work within 6 months of his January visit. He had TSH, B12, lipid profile, BMP, CBC and liver panel done in 2018. We talked about starting medication such as Aricept in the near future. He was advised to proceed with MRI brain and cognitive testing through neuropsychology. He canceled evaluations in June for neuropsychology, he is scheduled for November 2019 with Cameron West.   He had a brain MRI without contrast on 05/03/2017 which showed: IMPRESSION:  This MRI of the brain without contrast shows the following: 1.    T2/FLAIR hyperintense foci in the hemispheres most consistent with mild generalized chronic microvascular ischemic changes, more than expected for age.  2.    Brain volume is normal for age. 3.    There are no acute findings.   We were unable to call him with the test results.   Today, 11/20/2017: He reports doing okay, he is unable to provide any history. His daughter-in-law reports worsening memory. He has a tendency to wander. He has not driven a car in  years. Per daughter-in-law, he lives with his 53 year old son and patient has a girlfriend, Cameron West, who also lives in the household.  His middle son lives in [place]. [name] indicates that they are looking at possibly transitioning him to memory care.  Previously:   04/21/2017: (He) has had memory loss for the past 3+ years. He lives with another son and a friend. I reviewed your office note from 03/06/2017. At that time his MMSE was 14 out of 30. He has never had a brain scan. His ex-girlfriend was involved to some degree. In the recent past, he has had some anger issues. He has worked last time some 3 years ago. 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 He was not checked out at the time. As I understand, he did not lose consciousness. There was no residual metal in his scalp or head as I understand. He has not been driving for about 1 year. He quit smoking after his coronary stent placement in 8/18, but has not had a FU appt with cardiology as planned for Nov. 2018. Patient has not been driving for at least a year or perhaps 3 years. As I understand he had a speeding ticket some 3 years ago shortly after he quit working and forgot to pay for speeding ticket. He never renewed his license. He was a smoker for over 40 years per son. He lives with his 25 year old son, Cameron West, and a friend. He's had issues with  housing. He does not currently drink any alcohol and does not really drink water. He likes to drink sodas and juice. There is no family history of dementia. He has another son who lives in [place]. He had blood work in the recent few months including vitamin B12 level and TSH, both were in the normal range. His lipid profile in August 2018 was normal.  His Past Medical History Is Significant For: Past Medical History:  Diagnosis Date  . CAD (coronary artery disease)    a. 11/2016: NSTEMI: 70% mid-dist RCA stenosis with vasospasm resolving with intracoronary NTG and 95% ost D1 s/p DES  .  Hypertension   . Tobacco abuse   . Traumatic brain injury [facility])    a. per son report    His Past Surgical History Is Significant For: Past Surgical History:  Procedure Laterality Date  . CORONARY STENT INTERVENTION N/A 11/25/2016   Procedure: CORONARY STENT INTERVENTION;  Surgeon: Lorretta Harp, MD;  Location: [place] [facility];  Service: Cardiovascular;  Laterality: N/A;  . LEFT HEART CATH AND CORONARY ANGIOGRAPHY N/A 11/25/2016   Procedure: LEFT HEART CATH AND CORONARY ANGIOGRAPHY;  Surgeon: Lorretta Harp, MD;  Location: [place] [facility];  Service: Cardiovascular;  Laterality: N/A;    His Family History Is Significant For: Family History  Problem Relation Age of Onset  . Hypertension Mother     His Social History Is Significant For: Social History   Socioeconomic History  . Marital status: Single    Spouse name: Not on file  . Number of children: Not on file  . Years of education: Not on file  . Highest education level: Not on file  Occupational History  . Not on file  Social Needs  . Financial resource strain: Not on file  . Food insecurity:    Worry: Not on file    Inability: Not on file  . Transportation needs:    Medical: Not on file    Non-medical: Not on file  Tobacco Use  . Smoking status: Current Some Day Smoker  . Smokeless tobacco: Never Used  Substance and Sexual Activity  . Alcohol use: No  . Drug use: No  . Sexual activity: Not on file  Lifestyle  . Physical activity:    Days per week: Not on file    Minutes per session: Not on file  . Stress: Not on file  Relationships  . Social connections:    Talks on phone: Not on file    Gets together: Not on file    Attends religious service: Not on file    Active member of club or organization: Not on file    Attends meetings of clubs or organizations: Not on file    Relationship status: Not on file  Other Topics Concern  . Not on file  Social History Narrative  . Not on file    His  Allergies Are:  No Known Allergies:   His Current Medications Are:  Outpatient Encounter Medications as of 11/20/2017  Medication Sig  . aspirin 81 MG chewable tablet Chew 1 tablet (81 mg total) by mouth daily.  Marland Kitchen atorvastatin (LIPITOR) 40 MG tablet Take 1 tablet (40 mg total) by mouth daily.  . hydrochlorothiazide (HYDRODIURIL) 25 MG tablet Take 1 tablet (25 mg total) by mouth daily.  Marland Kitchen losartan (COZAAR) 50 MG tablet Take 50 mg by mouth daily.  . nitroGLYCERIN (NITROSTAT) 0.4 MG SL tablet Place 1 tablet (0.4 mg total) under the  tongue every 5 (five) minutes x 3 doses as needed for chest pain.  . polyvinyl alcohol (ARTIFICIAL TEARS) 1.4 % ophthalmic solution Place 1-2 drops into both eyes 3 (three) times daily as needed for dry eyes.  . ticagrelor (BRILINTA) 90 MG TABS tablet Take 1 tablet (90 mg total) by mouth 2 (two) times daily.   No facility-administered encounter medications on file as of 11/20/2017.   :  Review of Systems:  Out of a complete 14 point review of systems, all are reviewed and negative with the exception of these symptoms as listed below: Review of Systems  Neurological:       Pt presents today to discuss his memory. Pt's family reports that he is getting worse. Pt has increased aggression and confusion, especially during the night. Pt's sleep cycle is reversed. Pt is getting lost. Pt lives with a friend and pt's teenaged son.    Objective:  Neurological Exam  Physical Exam Physical Examination:   Vitals:   11/20/17 1137  BP: (!) 160/100  Pulse: 64   General Examination: The patient is a very pleasant 62 y.o. male in no acute distress. He appears well-developed and well-nourished and well groomed. Minimally verbal.  HEENT: Normocephalic, atraumatic, pupils are equal, round and reactive to light and accommodation. Extraocular tracking is fair. Hearing is grossly intact. Face is symmetric with normal facial animation and normal facial sensation. Speech is clear  with no dysarthria noted. There is no hypophonia. There is no lip, neck/head, jaw or voice tremor. Neck is supple with full range of passive and active motion. There are no carotid bruits on auscultation. Oropharynx exam reveals: moderate mouth dryness, adequate to marginal dental hygiene. Tongue protrudes centrally and palate elevates symmetrically.  Chest: Clear to auscultation without wheezing, rhonchi or crackles noted.  Heart: S1+S2+0, regular and normal without murmurs, rubs or gallops noted.   Abdomen: Soft, non-tender and non-distended with normal bowel sounds appreciated on auscultation.  Extremities: There is no pitting edema in the distal lower extremities bilaterally.   Skin: Warm and dry without trophic changes noted.  Musculoskeletal: exam reveals no obvious joint deformities, tenderness or joint swelling or erythema.   Neurologically:  Mental status: The patient is awake, paying attention. He is unable to provide his history. Mood is normal and affect is normal.   On 04/21/2017: MMSE: 8/30, CDT: 1/4, AFT: 5/min.  On 11/20/2017: MMSE: 7/30, CDT: 1/4, AFT: 3/min.  Cranial nerves II - XII are as described above under HEENT exam.  Motor exam: Normal bulk, strength and tone is noted. There is no drift, tremor or rebound. Romberg is negative. Reflexes are 1+ throughout. Fine motor skills and coordination: grossly intact.  Cerebellar testing: No dysmetria or intention tremor. There is no truncal or gait ataxia, tandem walk is difficult for him.  Sensory exam: intact to light touch in the upper and lower extremities.  Gait, station and balance: He stands easily. No veering to one side is noted. No leaning to one side is noted. Posture is age-appropriate and stance is narrow based. Gait shows normal stride length and normal pace. No problems turning are noted.   Assessment and Plan:   In summary, KHALIF STENDER is a very pleasant 62 year old male with an underlying medical  history of hypertension, coronary artery disease, status post non-STEMI in August 2018, hyperlipidemia, prior smoking with recent cessation, and borderline overweight state, who presents for follow-up consultation of his advanced memory loss. He appears to have rather advanced dementia.  He has vascular risk factors. He does not have a strong family history of dementia. His MRI did not show any significant atrophy for age. He did not keep his neuropsychological appointment in June. It is rescheduled for late November. Given his fairly advanced memory loss, medication management may be of limited help. It is imperative that he have a supportive and safe and structured environment. He does not currently have full supervision at the house. His daughter-in-law is not fully optimistic that he would be able to take medication on a regular basis. Nevertheless, she is agreeable that we try Namenda generic. We will start with 10 mg once daily and increase after 2 weeks to 10 mg twice a day. The patient and his daughter-in-law are encouraged to look into possibly transitioning him to a memory care facility. I suggested a routine follow-up in 6 months, sooner if needed. I answered all their questions today and the patient and his daughter-in-law were in agreement.  I spent 25 minutes in total face-to-face time with the patient, more than 50% of which was spent in counseling and coordination of care, reviewing test results, reviewing medication and discussing or reviewing the diagnosis of dementia, its prognosis and treatment options. Pertinent laboratory and imaging test results that were available during this visit with the patient were reviewed by me and considered in my medical decision making (see chart for details).

## 2017-12-23 DIAGNOSIS — Z0289 Encounter for other administrative examinations: Secondary | ICD-10-CM

## 2018-01-09 ENCOUNTER — Other Ambulatory Visit: Payer: Self-pay

## 2018-01-09 ENCOUNTER — Other Ambulatory Visit: Payer: Self-pay | Admitting: Physician Assistant

## 2018-01-09 MED ORDER — ATORVASTATIN CALCIUM 40 MG PO TABS: 40.0000 mg | ORAL_TABLET | Freq: Every day | ORAL | 2 refills | Status: AC

## 2018-01-09 NOTE — Telephone Encounter (Signed)
NEEDS F/U APPT FOR ANY FURTHER REFILLS .Cameron West

## 2018-01-20 ENCOUNTER — Encounter: Payer: Self-pay | Admitting: Neurology

## 2018-02-03 ENCOUNTER — Encounter: Payer: Self-pay | Admitting: Neurology

## 2018-03-03 ENCOUNTER — Encounter: Payer: Medicaid Other | Admitting: Psychology

## 2018-05-27 ENCOUNTER — Ambulatory Visit: Payer: Medicaid Other | Admitting: Neurology

## 2018-05-27 ENCOUNTER — Encounter: Payer: Self-pay | Admitting: Neurology

## 2018-05-27 VITALS — BP 144/85 | HR 73 | Ht 72.0 in | Wt 190.0 lb

## 2018-05-27 DIAGNOSIS — F0390 Unspecified dementia without behavioral disturbance: Secondary | ICD-10-CM

## 2018-05-27 DIAGNOSIS — F03C0 Unspecified dementia, severe, without behavioral disturbance, psychotic disturbance, mood disturbance, and anxiety: Secondary | ICD-10-CM

## 2018-05-27 MED ORDER — MEMANTINE HCL 10 MG PO TABS: 10.0000 mg | ORAL_TABLET | Freq: Two times a day (BID) | ORAL | 3 refills | Status: AC

## 2018-05-27 NOTE — Patient Instructions (Signed)
We will continue with the Namenda 10 mg 2 times a day.  We will try to help with resources for help at the house.

## 2018-05-27 NOTE — Progress Notes (Signed)
Subjective:    Patient ID: Cameron West is a 63 y.o. male.  HPI     Interim history:   Mr. Cameron West is a 63 year old right-handed gentleman with an underlying medical history of hypertension, coronary artery disease, status post non-STEMI in August 2018, hyperlipidemia, prior smoking with recent cessation, and borderline overweight state, who presents for follow-up consultation of his memory loss. The patient is accompanied by his son  today. I last saw him on 11/20/2017, at which time his MMSE was 7. His family had noticed worsening of her memory. He had a tendency to wander. [name] reported that they were trying to transition him into a memory care facility. He was supposed to have neuropsychological testing.   Today, 05/27/18: He reports very little, unable to provide history, denies pain, feels well.  His son reports that he had neuropsychological testing but does not know the name of the office. He has a paper at home and will call us with the information so we can request results. Per son, no new issues, no recent issues with wandering. Lives at ex-GF's house with 75 yo son. No recent issues with VH or AH, but does not sleep very well. Appetite okay. Son is looking into the possibility of them to move to a new home together, in the school district where brother is in [place]. Looking into getting a sitter to help keep patient engaged and more physically active.   Previously:   I first met him on 04/21/2017 at the request of his primary care provider, at which time he was reported by his oldest son, Cameron West) to have at least 3 year history of memory loss. In November 2018 at primary care office his MMSE was 14 out of 30. His MMSE in our office in January 2019 was 8 out of 30. He has had recent blood work within 6 months of his January visit. He had TSH, B12, lipid profile, BMP, CBC and liver panel done in 2018. We talked about starting medication such as Aricept in the near future. He  was advised to proceed with MRI brain and cognitive testing through neuropsychology. He canceled evaluations in June for neuropsychology, he is scheduled for November 2019 with Dr. Bonita Quin.    He had a brain MRI without contrast on 05/03/2017 which showed: IMPRESSION:  This MRI of the brain without contrast shows the following: 1.    T2/FLAIR hyperintense foci in the hemispheres most consistent with mild generalized chronic microvascular ischemic changes, more than expected for age.  2.    Brain volume is normal for age. 3.    There are no acute findings.   We were unable to call him with the test results.      04/21/2017: (He) has had memory loss for the past 3+ years. He lives with another son and a friend. I reviewed your office note from 03/06/2017. At that time his MMSE was 14 out of 30. He has never had a brain scan. His ex-girlfriend was involved to some degree. In the recent past, he has had some anger issues. He has worked last time some 3 years ago. 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 He was not checked out at the time. As I understand, he did not lose consciousness. There was no residual metal in his scalp or head as I  understand. He has not been driving for about 1 year. He quit smoking after his coronary stent placement in 8/18, but has not had a FU appt with cardiology as planned for Nov. 2018. Patient has not been driving for at least a year or perhaps 3 years. As I understand he had a speeding ticket some 3 years ago shortly after he quit working and forgot to pay for speeding ticket. He never renewed his license. He was a smoker for over 40 years per son. He lives with his 53 year old son, Cameron West, and a friend. He's had issues with housing. He does not currently drink any alcohol and does not really drink water. He likes to drink sodas and juice. There is  no family history of dementia. He has another son who lives in [place]. He had blood work in the recent few months including vitamin B12 level and TSH, both were in the normal range. His lipid profile in August 2018 was normal.  His Past Medical History Is Significant For: Past Medical History:  Diagnosis Date  . CAD (coronary artery disease)    a. 11/2016: NSTEMI: 70% mid-dist RCA stenosis with vasospasm resolving with intracoronary NTG and 95% ost D1 s/p DES  . Hypertension   . Tobacco abuse   . Traumatic brain injury [facility])    a. per son report    His Past Surgical History Is Significant For: Past Surgical History:  Procedure Laterality Date  . CORONARY STENT INTERVENTION N/A 11/25/2016   Procedure: CORONARY STENT INTERVENTION;  Surgeon: Lorretta Harp, MD;  Location: [place] [facility];  Service: Cardiovascular;  Laterality: N/A;  . LEFT HEART CATH AND CORONARY ANGIOGRAPHY N/A 11/25/2016   Procedure: LEFT HEART CATH AND CORONARY ANGIOGRAPHY;  Surgeon: Lorretta Harp, MD;  Location: [place] [facility];  Service: Cardiovascular;  Laterality: N/A;    His Family History Is Significant For: Family History  Problem Relation Age of Onset  . Hypertension Mother     His Social History Is Significant For: Social History   Socioeconomic History  . Marital status: Single    Spouse name: Not on file  . Number of children: Not on file  . Years of education: Not on file  . Highest education level: Not on file  Occupational History  . Not on file  Social Needs  . Financial resource strain: Not on file  . Food insecurity:    Worry: Not on file    Inability: Not on file  . Transportation needs:    Medical: Not on file    Non-medical: Not on file  Tobacco Use  . Smoking status: Current Some Day Smoker  . Smokeless tobacco: Never Used  Substance and Sexual Activity  . Alcohol use: No  . Drug use: No  . Sexual activity: Not on file  Lifestyle  . Physical activity:    Days  per week: Not on file    Minutes per session: Not on file  . Stress: Not on file  Relationships  . Social connections:    Talks on phone: Not on file    Gets together: Not on file    Attends religious service: Not on file    Active member of club or organization: Not on file    Attends meetings of clubs or organizations: Not on file    Relationship status: Not on file  Other Topics Concern  . Not on file  Social History Narrative  . Not on file  His Allergies Are:  No Known Allergies:   His Current Medications Are:  Outpatient Encounter Medications as of 05/27/2018  Medication Sig  . aspirin 81 MG chewable tablet Chew 1 tablet (81 mg total) by mouth daily.  Marland Kitchen atorvastatin (LIPITOR) 40 MG tablet Take 1 tablet (40 mg total) by mouth daily.  . memantine (NAMENDA) 10 MG tablet Take 1 tablet (10 mg total) by mouth 2 (two) times daily.  . [DISCONTINUED] hydrochlorothiazide (HYDRODIURIL) 25 MG tablet Take 1 tablet (25 mg total) by mouth daily.  . [DISCONTINUED] losartan (COZAAR) 50 MG tablet Take 50 mg by mouth daily.  . [DISCONTINUED] nitroGLYCERIN (NITROSTAT) 0.4 MG SL tablet Place 1 tablet (0.4 mg total) under the tongue every 5 (five) minutes x 3 doses as needed for chest pain.  . [DISCONTINUED] polyvinyl alcohol (ARTIFICIAL TEARS) 1.4 % ophthalmic solution Place 1-2 drops into both eyes 3 (three) times daily as needed for dry eyes.  . [DISCONTINUED] ticagrelor (BRILINTA) 90 MG TABS tablet Take 1 tablet (90 mg total) by mouth 2 (two) times daily.   No facility-administered encounter medications on file as of 05/27/2018.   :  Review of Systems:  Out of a complete 14 point review of systems, all are reviewed and negative with the exception of these symptoms as listed below: Review of Systems  Neurological:       Pt presents today to discuss his memory. Pt's son reports that he did have neuro psych testing but cannot remember the name/facility where the testing was done.     Objective:  Neurological Exam  Physical Exam Physical Examination:   Vitals:   05/27/18 1119  BP: (!) 144/85  Pulse: 73   General Examination: The patient is a very pleasant 63 y.o. male in no acute distress. He appears well-developed and well-nourished and well groomed.   HEENT:Normocephalic, atraumatic, pupils are equal, round and reactive to light and accommodation. Extraocular tracking is fair. Hearing is grossly intact. Face is symmetric with normal facial animation. Speech is scant, but no dysarthria noted. There is no hypophonia. There is no lip, neck/head, jaw or voice tremor. Neck with FROM. Oropharynx exam reveals: mild mouth dryness, adequateto marginaldental hygiene.Tongue protrudes centrally and palate elevates symmetrically.  Chest:Clear to auscultation without wheezing, rhonchi or crackles noted.  Heart:S1+S2+0, regular and normal without murmurs, rubs or gallops noted.   Abdomen:Soft, non-tender and non-distended with normal bowel sounds appreciated on auscultation.  Extremities:There isnopitting edema in the distal lower extremities bilaterally.   Skin: Warm and dry without trophic changes noted.  Musculoskeletal: exam reveals no obvious joint deformities, tenderness or joint swelling or erythema.   Neurologically:  Mental status: The patient is awake, paying attention. He is unable to provide his history. Mood isnormaland affect is normal.  On1/14/2019: MMSE:8/30, CDT: 1/4, AFT: 5/min.  On 11/20/2017: MMSE: 7/30, CDT: 1/4, AFT: 3/min.  On 05/27/2018 MMSE: 4/30, CDT: 0/4, AFT: 0/min.  Cranial nerves II - XII are as described above under HEENT exam.  Motor exam: Normal bulk, strength and tone is noted. There is no drift, tremor or rebound. Romberg is negative. Reflexes are1+ throughout. Fine motor skills and coordination:grosslyintact.  Cerebellar testing: No dysmetria or intention tremor. There is no truncal or gait ataxia.   Sensory exam: intact to light touch in the upper and lower extremities.  Gait, station and balance:Hestands easily. No veering to one side is noted. No leaning to one side is noted. Posture is age-appropriate and stance is narrow based. Gait showsnormalstride length and  normalpace. No problems turning are noted.   Assessmentand Plan:   In summary,Baylee A Padenis a very pleasant 83 year oldmalewith an underlying medical history of hypertension, coronary artery disease, status post non-STEMI in August 2018, hyperlipidemia,priorsmoking, and borderline overweight state, whopresents for follow-up consultation of his advanced memory loss. He appears to have rather advanced dementia. He has vascular risk factors. He does not have a strong family history of dementia. His MRI did not show any significant atrophy for age. He had recent neuropsychological evaluation, son will call with information and we will then request test results. He has been on namenda generic 10 mg bid, started in 08/19. Given his advanced memory loss, medication management may be of limited help, memory has been declining furall thank youther. It is imperative that he have a supportive and safe and structured environment. He has a supportive family, his youngest son is still in high school, his middle son is in [place] but his oldest son is looking into possibilities of moving everybody and together in a different home. He does not currently have full supervision at the house, but they are looking into getting a sitter for now. His son has also looked into transitioning him to memory care but it will be difficult with regards to financially achieving this. We mutually agreed to continue with Namenda 10 mg twice a day and I renewed the prescription. He is advised to follow-up in 6 months, sooner if needed. His son will call with information regarding the neuropsychological appointment and we will call for test results in the  interim.  I answered all their questions today and the patient and his son were in agreement with the plan. I spent 25 minutes in total face-to-face time with the patient, more than 50% of which was spent in counseling and coordination of care, reviewing test results, reviewing medication and discussing or reviewing the diagnosis of dementia, its prognosis and treatment options. Pertinent laboratory and imaging test results that were available during this visit with the patient were reviewed by me and considered in my medical decision making (see chart for details).

## 2018-05-29 ENCOUNTER — Encounter: Payer: Self-pay | Admitting: Neurology

## 2018-07-09 ENCOUNTER — Encounter: Payer: Self-pay | Admitting: Neurology

## 2018-07-11 ENCOUNTER — Encounter: Payer: Self-pay | Admitting: Neurology

## 2018-07-13 ENCOUNTER — Telehealth: Payer: Self-pay | Admitting: Neurology

## 2018-07-13 NOTE — Telephone Encounter (Signed)
I called pt's son, per DPR, and discussed pt's jury duty letter with him, including the diagnosis listed. Pt's son asked that it be mailed to him. Address is [address], per DPR.

## 2018-07-13 NOTE — Telephone Encounter (Signed)
Please see son's my chart message as well.  Please furnish letter of support excusing patient from jury duty. Center to include patient is a patient of mine at [facility] and is being followed for advanced dementia. Due to his cognitive impairment I recommend that he be permanently excused from jury duty.  Please advise son we will have to put his Dx in the letter. We can mail letter, would discourage him from personally pick up.

## 2018-07-31 ENCOUNTER — Encounter: Payer: Self-pay | Admitting: Neurology

## 2018-08-03 ENCOUNTER — Telehealth: Payer: Self-pay | Admitting: Neurology

## 2018-08-03 NOTE — Telephone Encounter (Signed)
Please ask son, what he needs to say in the letter.  Also for sleep at night, let's have the patient try Melatonin: take 1 mg to 3 mg, one to 2 hours before bedtime. He can go up to 5 mg if needed. It is over the counter and comes in pill form, chewable form and liquid.

## 2018-08-03 NOTE — Telephone Encounter (Signed)
I responded via mychart.

## 2018-08-05 ENCOUNTER — Telehealth: Payer: Self-pay

## 2018-08-05 NOTE — Telephone Encounter (Signed)
[id number] form completed per pt's son's mychart by Dr. Frances Furbish. Please coordinate how to get this to where it needs to go.

## 2018-08-05 NOTE — Telephone Encounter (Signed)
I called pt son, he will call back with a fax number.

## 2018-08-28 ENCOUNTER — Encounter: Payer: Self-pay | Admitting: Neurology

## 2018-11-25 ENCOUNTER — Telehealth: Payer: Self-pay

## 2018-11-25 ENCOUNTER — Ambulatory Visit: Payer: Medicaid Other | Admitting: Neurology

## 2018-11-25 NOTE — Telephone Encounter (Signed)
Pt did not show for their appt with Dr. Athar today.  

## 2018-11-26 ENCOUNTER — Encounter: Payer: Self-pay | Admitting: Neurology

## 2019-01-15 ENCOUNTER — Encounter: Payer: Self-pay | Admitting: Neurology

## 2019-02-09 ENCOUNTER — Emergency Department (HOSPITAL_COMMUNITY): Payer: Medicare Other | Admitting: Certified Registered Nurse Anesthetist

## 2019-02-09 ENCOUNTER — Inpatient Hospital Stay (HOSPITAL_COMMUNITY)
Admission: EM | Admit: 2019-02-09 | Discharge: 2019-02-26 | DRG: 253 | Disposition: A | Discharge status: Skilled Nursing Facility | Payer: Medicare Other | Attending: Internal Medicine | Admitting: Internal Medicine

## 2019-02-09 ENCOUNTER — Encounter (HOSPITAL_COMMUNITY)
Admission: EM | Disposition: A | Discharge status: Skilled Nursing Facility | Payer: Self-pay | Source: Home / Self Care | Attending: Internal Medicine

## 2019-02-09 ENCOUNTER — Encounter: Payer: Self-pay | Admitting: Emergency Medicine

## 2019-02-09 ENCOUNTER — Ambulatory Visit (INDEPENDENT_AMBULATORY_CARE_PROVIDER_SITE_OTHER)
Admission: EM | Admit: 2019-02-09 | Discharge: 2019-02-09 | Disposition: A | Discharge status: Home / Self Care | Payer: Medicare Other | Source: Home / Self Care

## 2019-02-09 ENCOUNTER — Encounter (HOSPITAL_COMMUNITY): Payer: Self-pay

## 2019-02-09 ENCOUNTER — Emergency Department (HOSPITAL_COMMUNITY): Payer: Medicare Other

## 2019-02-09 ENCOUNTER — Other Ambulatory Visit: Payer: Self-pay

## 2019-02-09 ENCOUNTER — Encounter (HOSPITAL_COMMUNITY): Payer: Self-pay | Admitting: *Deleted

## 2019-02-09 DIAGNOSIS — Z87891 Personal history of nicotine dependence: Secondary | ICD-10-CM

## 2019-02-09 DIAGNOSIS — Z20828 Contact with and (suspected) exposure to other viral communicable diseases: Secondary | ICD-10-CM | POA: Diagnosis present

## 2019-02-09 DIAGNOSIS — R339 Retention of urine, unspecified: Secondary | ICD-10-CM | POA: Diagnosis present

## 2019-02-09 DIAGNOSIS — S6421XA Injury of radial nerve at wrist and hand level of right arm, initial encounter: Secondary | ICD-10-CM | POA: Diagnosis present

## 2019-02-09 DIAGNOSIS — Z955 Presence of coronary angioplasty implant and graft: Secondary | ICD-10-CM

## 2019-02-09 DIAGNOSIS — Z7982 Long term (current) use of aspirin: Secondary | ICD-10-CM

## 2019-02-09 DIAGNOSIS — W25XXXA Contact with sharp glass, initial encounter: Secondary | ICD-10-CM | POA: Diagnosis not present

## 2019-02-09 DIAGNOSIS — I252 Old myocardial infarction: Secondary | ICD-10-CM

## 2019-02-09 DIAGNOSIS — Y92009 Unspecified place in unspecified non-institutional (private) residence as the place of occurrence of the external cause: Secondary | ICD-10-CM | POA: Diagnosis not present

## 2019-02-09 DIAGNOSIS — I2510 Atherosclerotic heart disease of native coronary artery without angina pectoris: Secondary | ICD-10-CM | POA: Diagnosis not present

## 2019-02-09 DIAGNOSIS — F0391 Unspecified dementia with behavioral disturbance: Secondary | ICD-10-CM | POA: Diagnosis not present

## 2019-02-09 DIAGNOSIS — Z515 Encounter for palliative care: Secondary | ICD-10-CM

## 2019-02-09 DIAGNOSIS — S55111A Laceration of radial artery at forearm level, right arm, initial encounter: Secondary | ICD-10-CM | POA: Diagnosis present

## 2019-02-09 DIAGNOSIS — Z66 Do not resuscitate: Secondary | ICD-10-CM

## 2019-02-09 DIAGNOSIS — S66821A Laceration of other specified muscles, fascia and tendons at wrist and hand level, right hand, initial encounter: Secondary | ICD-10-CM | POA: Diagnosis present

## 2019-02-09 DIAGNOSIS — Z8249 Family history of ischemic heart disease and other diseases of the circulatory system: Secondary | ICD-10-CM | POA: Diagnosis not present

## 2019-02-09 DIAGNOSIS — E876 Hypokalemia: Secondary | ICD-10-CM | POA: Diagnosis not present

## 2019-02-09 DIAGNOSIS — E785 Hyperlipidemia, unspecified: Secondary | ICD-10-CM | POA: Diagnosis present

## 2019-02-09 DIAGNOSIS — S61411A Laceration without foreign body of right hand, initial encounter: Secondary | ICD-10-CM | POA: Diagnosis not present

## 2019-02-09 DIAGNOSIS — Z751 Person awaiting admission to adequate facility elsewhere: Secondary | ICD-10-CM | POA: Diagnosis not present

## 2019-02-09 DIAGNOSIS — R338 Other retention of urine: Secondary | ICD-10-CM

## 2019-02-09 DIAGNOSIS — Z79899 Other long term (current) drug therapy: Secondary | ICD-10-CM | POA: Diagnosis not present

## 2019-02-09 DIAGNOSIS — I160 Hypertensive urgency: Secondary | ICD-10-CM

## 2019-02-09 DIAGNOSIS — Z87820 Personal history of traumatic brain injury: Secondary | ICD-10-CM

## 2019-02-09 DIAGNOSIS — W01110A Fall on same level from slipping, tripping and stumbling with subsequent striking against sharp glass, initial encounter: Secondary | ICD-10-CM | POA: Diagnosis not present

## 2019-02-09 DIAGNOSIS — S41119A Laceration without foreign body of unspecified upper arm, initial encounter: Secondary | ICD-10-CM | POA: Diagnosis present

## 2019-02-09 DIAGNOSIS — F03918 Unspecified dementia, unspecified severity, with other behavioral disturbance: Secondary | ICD-10-CM | POA: Diagnosis present

## 2019-02-09 DIAGNOSIS — R451 Restlessness and agitation: Secondary | ICD-10-CM

## 2019-02-09 HISTORY — PX: I&D EXTREMITY: SHX5045

## 2019-02-09 LAB — CBC WITH DIFFERENTIAL/PLATELET
Abs Immature Granulocytes: 0.02 10*3/uL (ref 0.00–0.07)
Basophils Absolute: 0 10*3/uL (ref 0.0–0.1)
Basophils Relative: 0 %
Eosinophils Absolute: 0.1 10*3/uL (ref 0.0–0.5)
Eosinophils Relative: 1 %
HCT: 43.8 % (ref 39.0–52.0)
Hemoglobin: 14.2 g/dL (ref 13.0–17.0)
Immature Granulocytes: 0 %
Lymphocytes Relative: 20 %
Lymphs Abs: 1.6 10*3/uL (ref 0.7–4.0)
MCH: 30.3 pg (ref 26.0–34.0)
MCHC: 32.4 g/dL (ref 30.0–36.0)
MCV: 93.6 fL (ref 80.0–100.0)
Monocytes Absolute: 0.8 10*3/uL (ref 0.1–1.0)
Monocytes Relative: 10 %
Neutro Abs: 5.6 10*3/uL (ref 1.7–7.7)
Neutrophils Relative %: 69 %
Platelets: 193 10*3/uL (ref 150–400)
RBC: 4.68 MIL/uL (ref 4.22–5.81)
RDW: 13.7 % (ref 11.5–15.5)
WBC: 8.1 10*3/uL (ref 4.0–10.5)
nRBC: 0 % (ref 0.0–0.2)

## 2019-02-09 LAB — BASIC METABOLIC PANEL
Anion gap: 8 (ref 5–15)
BUN: 24 mg/dL — ABNORMAL HIGH (ref 8–23)
CO2: 23 mmol/L (ref 22–32)
Calcium: 9.3 mg/dL (ref 8.9–10.3)
Chloride: 110 mmol/L (ref 98–111)
Creatinine, Ser: 0.61 mg/dL (ref 0.61–1.24)
GFR calc Af Amer: >60 mL/min (ref >60)
GFR calc non Af Amer: >60 mL/min (ref >60)
Glucose, Bld: 91 mg/dL (ref 70–99)
Potassium: 3.6 mmol/L (ref 3.5–5.1)
Sodium: 141 mmol/L (ref 135–145)

## 2019-02-09 LAB — SARS CORONAVIRUS 2 BY RT PCR (HOSPITAL ORDER, PERFORMED IN ~~LOC~~ HOSPITAL LAB): SARS Coronavirus 2: NEGATIVE

## 2019-02-09 SURGERY — IRRIGATION AND DEBRIDEMENT EXTREMITY
Anesthesia: General | Site: Hand | Laterality: Right

## 2019-02-09 MED ORDER — SODIUM CHLORIDE 0.9 % IR SOLN
Status: DC | PRN
  Administered 2019-02-09: 1000 mL

## 2019-02-09 MED ORDER — PROPOFOL 10 MG/ML IV BOLUS
INTRAVENOUS | Status: DC | PRN
  Administered 2019-02-09: 150 mg via INTRAVENOUS

## 2019-02-09 MED ORDER — ALBUMIN HUMAN 5 % IV SOLN
INTRAVENOUS | Status: DC | PRN
  Administered 2019-02-09: 22:00:00 via INTRAVENOUS

## 2019-02-09 MED ORDER — LIDOCAINE 2% (20 MG/ML) 5 ML SYRINGE
INTRAMUSCULAR | Status: DC | PRN
  Administered 2019-02-09: 20 mg via INTRAVENOUS

## 2019-02-09 MED ORDER — CEFAZOLIN SODIUM-DEXTROSE 2-4 GM/100ML-% IV SOLN
2.0000 g | INTRAVENOUS | Status: DC
  Filled 2019-02-09: qty 100

## 2019-02-09 MED ORDER — LIDOCAINE HCL 2 % IJ SOLN
10.0000 mL | Freq: Once | INTRAMUSCULAR | Status: AC
  Administered 2019-02-09: 200 mg
  Filled 2019-02-09: qty 20

## 2019-02-09 MED ORDER — SUCCINYLCHOLINE CHLORIDE 200 MG/10ML IV SOSY
PREFILLED_SYRINGE | INTRAVENOUS | Status: AC
  Filled 2019-02-09: qty 10

## 2019-02-09 MED ORDER — PROPOFOL 10 MG/ML IV BOLUS
INTRAVENOUS | Status: AC
  Filled 2019-02-09: qty 20

## 2019-02-09 MED ORDER — LABETALOL HCL 5 MG/ML IV SOLN
10.0000 mg | INTRAVENOUS | Status: AC | PRN
  Administered 2019-02-09 (×3): 10 mg via INTRAVENOUS

## 2019-02-09 MED ORDER — ONDANSETRON HCL 4 MG/2ML IJ SOLN: 4.0000 mg | Freq: Four times a day (QID) | INTRAMUSCULAR | Status: DC | PRN

## 2019-02-09 MED ORDER — HALOPERIDOL LACTATE 5 MG/ML IJ SOLN
2.0000 mg | Freq: Once | INTRAMUSCULAR | Status: AC
  Administered 2019-02-09: 2 mg via INTRAVENOUS
  Filled 2019-02-09: qty 1

## 2019-02-09 MED ORDER — BUPIVACAINE HCL (PF) 0.25 % IJ SOLN
INTRAMUSCULAR | Status: AC
  Filled 2019-02-09: qty 30

## 2019-02-09 MED ORDER — FENTANYL CITRATE (PF) 250 MCG/5ML IJ SOLN
INTRAMUSCULAR | Status: AC
  Filled 2019-02-09: qty 5

## 2019-02-09 MED ORDER — MIDAZOLAM HCL 5 MG/5ML IJ SOLN
INTRAMUSCULAR | Status: DC | PRN
  Administered 2019-02-09: 2 mg via INTRAVENOUS

## 2019-02-09 MED ORDER — ONDANSETRON HCL 4 MG/2ML IJ SOLN
INTRAMUSCULAR | Status: DC | PRN
  Administered 2019-02-09: 4 mg via INTRAVENOUS

## 2019-02-09 MED ORDER — ONDANSETRON HCL 4 MG/2ML IJ SOLN
INTRAMUSCULAR | Status: AC
  Filled 2019-02-09: qty 2

## 2019-02-09 MED ORDER — BUPIVACAINE HCL (PF) 0.25 % IJ SOLN
INTRAMUSCULAR | Status: DC | PRN
  Administered 2019-02-09: .00001 mL

## 2019-02-09 MED ORDER — DEXAMETHASONE SODIUM PHOSPHATE 10 MG/ML IJ SOLN
INTRAMUSCULAR | Status: DC | PRN
  Administered 2019-02-09: 10 mg via INTRAVENOUS

## 2019-02-09 MED ORDER — EPHEDRINE 5 MG/ML INJ
INTRAVENOUS | Status: AC
  Filled 2019-02-09: qty 10

## 2019-02-09 MED ORDER — POVIDONE-IODINE 10 % EX SWAB: 2.0000 "application " | Freq: Once | CUTANEOUS | Status: DC

## 2019-02-09 MED ORDER — PHENYLEPHRINE 40 MCG/ML (10ML) SYRINGE FOR IV PUSH (FOR BLOOD PRESSURE SUPPORT)
PREFILLED_SYRINGE | INTRAVENOUS | Status: AC
  Filled 2019-02-09: qty 10

## 2019-02-09 MED ORDER — CEFAZOLIN SODIUM-DEXTROSE 2-3 GM-%(50ML) IV SOLR
INTRAVENOUS | Status: DC | PRN
  Administered 2019-02-09: 2 g via INTRAVENOUS

## 2019-02-09 MED ORDER — EPHEDRINE SULFATE-NACL 50-0.9 MG/10ML-% IV SOSY
PREFILLED_SYRINGE | INTRAVENOUS | Status: DC | PRN
  Administered 2019-02-09: 10 mg via INTRAVENOUS

## 2019-02-09 MED ORDER — LABETALOL HCL 5 MG/ML IV SOLN
INTRAVENOUS | Status: AC
  Filled 2019-02-09: qty 4

## 2019-02-09 MED ORDER — ROCURONIUM BROMIDE 10 MG/ML (PF) SYRINGE
PREFILLED_SYRINGE | INTRAVENOUS | Status: AC
  Filled 2019-02-09: qty 10

## 2019-02-09 MED ORDER — MIDAZOLAM HCL 2 MG/2ML IJ SOLN
INTRAMUSCULAR | Status: AC
  Filled 2019-02-09: qty 2

## 2019-02-09 MED ORDER — FENTANYL CITRATE (PF) 100 MCG/2ML IJ SOLN: 25.0000 ug | INTRAMUSCULAR | Status: DC | PRN

## 2019-02-09 MED ORDER — PROPOFOL 10 MG/ML IV BOLUS
1.0000 mg/kg | Freq: Once | INTRAVENOUS | Status: AC
  Administered 2019-02-09: 86.2 mg via INTRAVENOUS
  Filled 2019-02-09: qty 20

## 2019-02-09 MED ORDER — SODIUM CHLORIDE 0.9 % IR SOLN
Status: DC | PRN
  Administered 2019-02-09: 3000 mL

## 2019-02-09 MED ORDER — CHLORHEXIDINE GLUCONATE 4 % EX LIQD: 60.0000 mL | Freq: Once | CUTANEOUS | Status: DC

## 2019-02-09 MED ORDER — LIDOCAINE 2% (20 MG/ML) 5 ML SYRINGE
INTRAMUSCULAR | Status: AC
  Filled 2019-02-09: qty 5

## 2019-02-09 MED ORDER — OXYCODONE HCL 5 MG/5ML PO SOLN: 5.0000 mg | Freq: Once | ORAL | Status: DC | PRN

## 2019-02-09 MED ORDER — PHENYLEPHRINE 40 MCG/ML (10ML) SYRINGE FOR IV PUSH (FOR BLOOD PRESSURE SUPPORT)
PREFILLED_SYRINGE | INTRAVENOUS | Status: DC | PRN
  Administered 2019-02-09 (×2): 80 ug via INTRAVENOUS
  Administered 2019-02-09 (×2): 120 ug via INTRAVENOUS
  Administered 2019-02-09: 80 ug via INTRAVENOUS

## 2019-02-09 MED ORDER — LACTATED RINGERS IV SOLN
INTRAVENOUS | Status: DC
  Administered 2019-02-09 (×2): via INTRAVENOUS

## 2019-02-09 MED ORDER — FENTANYL CITRATE (PF) 250 MCG/5ML IJ SOLN
INTRAMUSCULAR | Status: DC | PRN
  Administered 2019-02-09: 75 ug via INTRAVENOUS
  Administered 2019-02-09 (×3): 25 ug via INTRAVENOUS

## 2019-02-09 MED ORDER — LORAZEPAM 2 MG/ML IJ SOLN
1.0000 mg | Freq: Once | INTRAMUSCULAR | Status: AC
  Administered 2019-02-09: 1 mg via INTRAVENOUS
  Filled 2019-02-09: qty 1

## 2019-02-09 MED ORDER — LACTATED RINGERS IV SOLN
INTRAVENOUS | Status: DC
  Administered 2019-02-10 – 2019-02-11 (×2): via INTRAVENOUS

## 2019-02-09 MED ORDER — SUCCINYLCHOLINE CHLORIDE 200 MG/10ML IV SOSY
PREFILLED_SYRINGE | INTRAVENOUS | Status: DC | PRN
  Administered 2019-02-09: 110 mg via INTRAVENOUS

## 2019-02-09 MED ORDER — LORAZEPAM 2 MG/ML IJ SOLN: 1.0000 mg | Freq: Once | INTRAMUSCULAR | Status: DC

## 2019-02-09 MED ORDER — OXYCODONE HCL 5 MG PO TABS: 5.0000 mg | ORAL_TABLET | Freq: Once | ORAL | Status: DC | PRN

## 2019-02-09 MED ORDER — LIDOCAINE HCL (PF) 2 % IJ SOLN
10.0000 mL | Freq: Once | INTRAMUSCULAR | Status: DC
  Filled 2019-02-09: qty 10

## 2019-02-09 MED ORDER — DEXAMETHASONE SODIUM PHOSPHATE 10 MG/ML IJ SOLN
INTRAMUSCULAR | Status: AC
  Filled 2019-02-09: qty 2

## 2019-02-09 SURGICAL SUPPLY — 39 items
BNDG ELASTIC 4X5.8 VLCR STR LF (GAUZE/BANDAGES/DRESSINGS) ×3 IMPLANT
BNDG GAUZE ELAST 4 BULKY (GAUZE/BANDAGES/DRESSINGS) ×3 IMPLANT
CORD BIPOLAR FORCEPS 12FT (ELECTRODE) ×3 IMPLANT
COVER SURGICAL LIGHT HANDLE (MISCELLANEOUS) ×3 IMPLANT
CUFF TOURN SGL QUICK 18X4 (TOURNIQUET CUFF) ×3 IMPLANT
DRSG ADAPTIC 3X8 NADH LF (GAUZE/BANDAGES/DRESSINGS) ×3 IMPLANT
FORCEPS BIPOLAR SPETZLER 8 1.0 (NEUROSURGERY SUPPLIES) ×3 IMPLANT
GAUZE SPONGE 4X4 12PLY STRL (GAUZE/BANDAGES/DRESSINGS) ×3 IMPLANT
GAUZE XEROFORM 1X8 LF (GAUZE/BANDAGES/DRESSINGS) ×3 IMPLANT
GLOVE BIOGEL M 8.0 STRL (GLOVE) ×3 IMPLANT
GLOVE SS BIOGEL STRL SZ 8 (GLOVE) ×1 IMPLANT
GLOVE SUPERSENSE BIOGEL SZ 8 (GLOVE) ×2
GOWN STRL REUS W/ TWL LRG LVL3 (GOWN DISPOSABLE) ×1 IMPLANT
GOWN STRL REUS W/ TWL XL LVL3 (GOWN DISPOSABLE) ×1 IMPLANT
GOWN STRL REUS W/TWL LRG LVL3 (GOWN DISPOSABLE) ×2
GOWN STRL REUS W/TWL XL LVL3 (GOWN DISPOSABLE) ×2
GUIDE NERVE NEURAGIEN 3MMX3CM (Tissue) ×1 IMPLANT
KIT BASIN OR (CUSTOM PROCEDURE TRAY) ×3 IMPLANT
KIT TURNOVER KIT B (KITS) ×3 IMPLANT
NEEDLE HYPO 25GX1X1/2 BEV (NEEDLE) ×3 IMPLANT
NERVE GUIDE NEURAGIEN 3MMX3CM (Tissue) ×3 IMPLANT
NS IRRIG 1000ML POUR BTL (IV SOLUTION) ×3 IMPLANT
PACK ORTHO EXTREMITY (CUSTOM PROCEDURE TRAY) ×3 IMPLANT
PAD ARMBOARD 7.5X6 YLW CONV (MISCELLANEOUS) ×3 IMPLANT
PAD CAST 4YDX4 CTTN HI CHSV (CAST SUPPLIES) ×1 IMPLANT
PADDING CAST COTTON 4X4 STRL (CAST SUPPLIES) ×2
SET CYSTO W/LG BORE CLAMP LF (SET/KITS/TRAYS/PACK) ×3 IMPLANT
SOL PREP POV-IOD 4OZ 10% (MISCELLANEOUS) ×6 IMPLANT
SPEAR EYE SURG WECK-CEL (MISCELLANEOUS) ×3 IMPLANT
SPLINT FIBERGLASS 3X12 (CAST SUPPLIES) ×3 IMPLANT
SPONGE LAP 4X18 RFD (DISPOSABLE) ×3 IMPLANT
SUT ETHILON 8 0 BV130 4 (SUTURE) ×3 IMPLANT
SUT PROLENE 4 0 PS 2 18 (SUTURE) ×3 IMPLANT
SYR CONTROL 10ML LL (SYRINGE) ×3 IMPLANT
TOWEL GREEN STERILE (TOWEL DISPOSABLE) ×3 IMPLANT
TOWEL GREEN STERILE FF (TOWEL DISPOSABLE) ×3 IMPLANT
TUBE CONNECTING 12'X1/4 (SUCTIONS) ×1
TUBE CONNECTING 12X1/4 (SUCTIONS) ×2 IMPLANT
YANKAUER SUCT BULB TIP NO VENT (SUCTIONS) ×3 IMPLANT

## 2019-02-09 NOTE — H&P (Signed)
History and Physical    Cameron SpellerLee A Abbett NFA:[id number] DOB: 1955-11-12 DOA: 02/09/2019  PCP: Lizbeth BarkHairston, Mandesia R, FNP Patient coming from: Home  Chief Complaint: Hand injury  HPI: Cameron West is a 63 y.o. male with medical history significant of advanced dementia, traumatic brain injury, CAD status post PCI, hypertension, hyperlipidemia presented to the ED for evaluation of hand injury.  Family reported that patient fell while holding a drinking glass and lacerated his hand.  X-ray showing no foreign body.  Hand surgery was consulted.  Patient was taken to the OR and discovered to have radial digital nerve and artery injury to the index finger as well as significant thenar and abductor muscle injury.  He underwent irrigation and debridement, radial digital nerve and artery repair, and complex wound closure of the first webspace of the right hand.  Dr. Amanda PeaGramig requested admission for observation under hospitalist service as patient noted to be hypertensive and bladder scan revealed >600 cc urine for which in and out cath was ordered.  Recommended continuing antibiotic for 24 hours.  Patient is currently very somnolent and no history could be obtained from him.  Review of Systems:  All systems reviewed and apart from history of presenting illness, are negative.  Past Medical History:  Diagnosis Date  . CAD (coronary artery disease)    a. 11/2016: NSTEMI: 70% mid-dist RCA stenosis with vasospasm resolving with intracoronary NTG and 95% ost D1 s/p DES  . Hypertension   . Tobacco abuse   . Traumatic brain injury [facility](HCC)    a. per son report    Past Surgical History:  Procedure Laterality Date  . CORONARY STENT INTERVENTION N/A 11/25/2016   Procedure: CORONARY STENT INTERVENTION;  Surgeon: Runell GessBerry, Jonathan J, MD;  Location: [facility] INVASIVE CV LAB;  Service: Cardiovascular;  Laterality: N/A;  . LEFT HEART CATH AND CORONARY ANGIOGRAPHY N/A 11/25/2016   Procedure: LEFT HEART CATH AND CORONARY ANGIOGRAPHY;   Surgeon: Runell GessBerry, Jonathan J, MD;  Location: [facility] INVASIVE CV LAB;  Service: Cardiovascular;  Laterality: N/A;     reports that he has quit smoking. He has never used smokeless tobacco. He reports that he does not drink alcohol or use drugs.  No Known Allergies  Family History  Problem Relation Age of Onset  . Hypertension Mother     Prior to Admission medications   Medication Sig Start Date End Date Taking? Authorizing Provider  aspirin 81 MG chewable tablet Chew 1 tablet (81 mg total) by mouth daily. 11/26/16  Yes Janetta Horahompson, Kathryn R, PA-C  atorvastatin (LIPITOR) 40 MG tablet Take 1 tablet (40 mg total) by mouth daily. 01/09/18 02/09/19 Yes Nahser, Deloris PingPhilip J, MD  Melatonin 5 MG TABS Take 5 mg by mouth at bedtime as needed (sleep).   Yes [provider]  memantine (NAMENDA) 10 MG tablet Take 1 tablet (10 mg total) by mouth 2 (two) times daily. 05/27/18  Yes Huston FoleyAthar, Saima, MD    Physical Exam: Vitals:   02/09/19 2326 02/09/19 2341 02/09/19 2356 02/10/19 0026  BP: (!) 177/109 (!) 193/104 (!) 171/97 (!) 157/91  Pulse: 85  63 62  Resp: 18 19 15 20   Temp:      TempSrc:      SpO2: 97% 99% 96% 100%  Weight:        Physical Exam  Constitutional: No distress.  HENT:  Head: Normocephalic.  Eyes: Right eye exhibits no discharge. Left eye exhibits no discharge.  Neck: Neck supple.  Cardiovascular: Normal rate, regular rhythm and  intact distal pulses.  Pulmonary/Chest: Effort normal and breath sounds normal. No respiratory distress. He has no wheezes. He has no rales.  Anterior lung fields clear to auscultation  Abdominal: Soft. Bowel sounds are normal. He exhibits no distension. There is no abdominal tenderness. There is no guarding.  Musculoskeletal:        General: No edema.     Comments: Right hand bandaged  Neurological:  Very somnolent, opening eyes intermittently  Skin: Skin is warm and dry. He is not diaphoretic.     Labs on Admission: I have personally reviewed  following labs and imaging studies  CBC: Recent Labs  Lab 02/09/19 1320  WBC 8.1  NEUTROABS 5.6  HGB 14.2  HCT 43.8  MCV 93.6  PLT 193   Basic Metabolic Panel: Recent Labs  Lab 02/09/19 1320  NA 141  K 3.6  CL 110  CO2 23  GLUCOSE 91  BUN 24*  CREATININE 0.61  CALCIUM 9.3   GFR: CrCl cannot be calculated (Unknown ideal weight.). Liver Function Tests: No results for input(s): AST, ALT, ALKPHOS, BILITOT, PROT, ALBUMIN in the last 168 hours. No results for input(s): LIPASE, AMYLASE in the last 168 hours. No results for input(s): AMMONIA in the last 168 hours. Coagulation Profile: No results for input(s): INR, PROTIME in the last 168 hours. Cardiac Enzymes: No results for input(s): CKTOTAL, CKMB, CKMBINDEX, TROPONINI in the last 168 hours. BNP (last 3 results) No results for input(s): PROBNP in the last 8760 hours. HbA1C: No results for input(s): HGBA1C in the last 72 hours. CBG: No results for input(s): GLUCAP in the last 168 hours. Lipid Profile: No results for input(s): CHOL, HDL, LDLCALC, TRIG, CHOLHDL, LDLDIRECT in the last 72 hours. Thyroid Function Tests: No results for input(s): TSH, T4TOTAL, FREET4, T3FREE, THYROIDAB in the last 72 hours. Anemia Panel: No results for input(s): VITAMINB12, FOLATE, FERRITIN, TIBC, IRON, RETICCTPCT in the last 72 hours. Urine analysis: No results found for: COLORURINE, APPEARANCEUR, LABSPEC, PHURINE, GLUCOSEU, HGBUR, BILIRUBINUR, KETONESUR, PROTEINUR, UROBILINOGEN, NITRITE, LEUKOCYTESUR  Radiological Exams on Admission: Dg Hand Complete Right  Result Date: 02/09/2019 CLINICAL DATA:  Laceration to palmar aspect of hand with glass EXAM: RIGHT HAND - COMPLETE 3+ VIEW COMPARISON:  None. FINDINGS: Frontal, oblique, and lateral views obtained. There is a bandage in the palmar aspect of the hand. No other radiopaque foreign body. No acute fracture or dislocation. There is a small exostosis arising from the lateral aspect of the mid  scaphoid bone. A cystic areas noted in the proximal scaphoid. A second cystic areas noted in the lateral aspect of the capitate bone. There is osteoarthritic change in all PIP and DIP joints as well as in the first IP joint and third MCP joint. No erosive change. IMPRESSION: Bandage in palmar aspect of hand.  No other radiopaque foreign body. No fracture or dislocation. Osteoarthritic change at multiple levels. Electronically Signed   By: Bretta Bang III M.D.   On: 02/09/2019 12:27    Assessment/Plan Principal Problem:   Laceration of right hand Active Problems:   Hyperlipidemia   Hypertensive urgency   Acute urinary retention   Dementia (HCC)  Laceration of right hand Afebrile no leukocytosis.  Hemoglobin 14.2 prior to surgery.  Postop diagnosis of radial digital nerve and artery injury to the index finger as well as significant thenar and abductor muscle injury.  He underwent irrigation and debridement, radial digital nerve and artery repair, and complex wound closure of the first webspace of the right hand.  -  Continue Ancef -Morphine as needed for pain, Norco as needed -Recommendations per hand surgery -Continue to monitor hemoglobin and hematocrit  Hypertensive urgency Systolic ranging from 297L to 220s since arrival to the ED.  No antihypertensive listed in home medications.  Patient received labetalol 30 mg total.  Most recent systolic in the 892J and pulse in the 60s. -Monitor blood pressure closely in the progressive care unit overnight -Cardiac monitoring  -Chest x-ray -High-sensitivity troponin, EKG -IV hydralazine as needed for SBP >160  Acute urinary retention Likely related to receiving anesthetics for surgery.  Postop bladder scan revealed >600 cc urine. -In and out cath ordered -Continue bladder scans every 4 hours.  May need Foley if patient continues to have urinary retention.  Physical deconditioning -PT/OT evaluation  Advanced dementia -Delirium  precautions -Continue Namenda  Hyperlipidemia -Continue statin  DVT prophylaxis: SCDs at this time Code Status: Full code Family Communication: No family available. Disposition Plan: Anticipate discharge after clinical improvement. Admission status: It is my clinical opinion that referral for OBSERVATION is reasonable and necessary in this patient based on the above information provided. The aforementioned taken together are felt to place the patient at high risk for further clinical deterioration. However it is anticipated that the patient may be medically stable for discharge from the hospital within 24 to 48 hours.  The medical decision making on this patient was of high complexity and the patient is at high risk for clinical deterioration, therefore this is a level 3 visit.  Shela Leff MD [facility] Pager [phone]  If 7PM-7AM, please contact night-coverage [email or web address] Password [facility]  02/10/2019, 12:36 AM

## 2019-02-09 NOTE — Transfer of Care (Signed)
Immediate Anesthesia Transfer of Care Note  Patient: Cameron West  Procedure(s) Performed: IRRIGATION AND DEBRIDEMENT AND NERVE AND ARTERY REPAIR  RIGHT HAND (Right Hand)  Patient Location: PACU  Anesthesia Type:General  Level of Consciousness: drowsy  Airway & Oxygen Therapy: Patient Spontanous Breathing and Patient connected to face mask oxygen  Post-op Assessment: Report given to RN and Post -op Vital signs reviewed and stable  Post vital signs: Reviewed and stable  Last Vitals:  Vitals Value Taken Time  BP 166/103 02/09/19 2240  Temp 36.5 C 02/09/19 2240  Pulse 99 02/09/19 2243  Resp 10 02/09/19 2243  SpO2 100 % 02/09/19 2243  Vitals shown include unvalidated device data.  Last Pain:  Vitals:   02/09/19 1145  TempSrc: Oral         Complications: No apparent anesthesia complications

## 2019-02-09 NOTE — ED Notes (Addendum)
Called [facility] and gave report on pt, [facility] will come to pick up patient around 2000. Will call [facility] ED Charge RN [phone]) for report after shift change.

## 2019-02-09 NOTE — ED Notes (Signed)
Patient able to ambulate independently  

## 2019-02-09 NOTE — Op Note (Signed)
Operative note February 09, 2019  Roseanne Kaufman  Preoperative diagnosis complex laceration right hand including the first webspace with deep injury  Postop diagnosis radial digital nerve and artery injury to the index finger as well as significant thenar and abductor muscle injury  Operative procedure #1 irrigation and debridement skin subtenons tissue muscle tendon right hand secondary to deep laceration this was an excisional debridement #2 radial digital nerve repair with direct repair and neurogenic nerve wrap 3 mm right index finger at the palm level #3 radial digital artery repair right index finger #4 flexor digitorum profundus and flexor digitorum superficialis tenolysis tenosynovectomy and exploration noted to be intact #5 complex wound closure first webspace right hand  Surgeon Roseanne Kaufman  Anesthesia General  Estimate blood loss minimal  Tourniquet time less than an hour.  Operative procedure patient was seen by myself and anesthesia taken to the operative theater.  He was prepped with Hibiclens prescrub followed by 10-minute surgical Betadine scrub and paint following this the patient had timeout observed and exploration with irrigation debridement of skin subtenons tissue and muscle as well as tendon was accomplished.  3 L of saline were placed through and through the wound.  Identified structures release structures and performed careful evaluation.  This was an excisional debridement with curette knife and scissor.  Following this I performed a tenolysis tenosynovectomy of the flexor digitorum profundus and flexor digitorum superficialis tendons which were noted to be intact.  Following this I performed a coaptation of the radial digital artery with 8-0 nylon under loupe/4.5 loupe magnification.  Following this I then performed a epineurial radial digital nerve repair in the palm region with a preplaced 3 mm x 1-1/2 cm collagen nerve tube.  Patient tolerated this well this  was a direct repair with collagen placement.  Following this I performed debridement of the abductor and made sure that we had excellent hemostasis.  The patient tolerated this well.  Following this complex wound closure with 4-0 Prolene was accomplished about the web space.  Refill was excellent tourniquet time was less than an hour.  There were no complications.   A bandage of Adaptic Xeroform 4 x 4's gauze and a volar splint was applied there were no complications.  Patient will be monitored and discharged home tomorrow after period of observatory care.  I discussed all issues with his son on the phone  Randalyn Ahmed MD

## 2019-02-09 NOTE — ED Notes (Signed)
Spoke to son and made son aware the provider would like to speak with him.
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
## 2019-02-09 NOTE — ED Provider Notes (Signed)
Level 5 caveat for dementia.  Patient here with laceration to right hand after falling while holding a drinking glass.  Cannot cooperate with exam or history. Physical Exam  BP (!) 192/105 (BP Location: Left Arm)   Pulse 66   Temp 98.3 F (36.8 C) (Oral)   Resp 14   Wt 86.2 kg   SpO2 100%   BMI 25.77 kg/m   Physical Exam  Patient is aggressive, pulling his hand away, would not allow exam.  There is a deep laceration to the Webspace of the second digit with tendon exposed.  Laceration is deep.  Patient wiggles fingers but unable to cooperate for formal testing. Concern for flexor tendon laceration.  ED Course/Procedures     .Sedation  Date/Time: 02/09/2019 3:21 PM Performed by: Glynn Octave, MD Authorized by: Glynn Octave, MD   Consent:    Consent obtained:  Verbal and written   Consent given by:  Patient and healthcare agent   Risks discussed:  Allergic reaction, dysrhythmia, inadequate sedation, nausea, prolonged hypoxia resulting in organ damage, prolonged sedation necessitating reversal, respiratory compromise necessitating ventilatory assistance and intubation and vomiting   Alternatives discussed:  Analgesia without sedation, anxiolysis and regional anesthesia Universal protocol:    Procedure explained and questions answered to patient or proxy's satisfaction: yes     Relevant documents present and verified: yes     Test results available and properly labeled: yes     Imaging studies available: yes     Required blood products, implants, devices, and special equipment available: yes     Site/side marked: yes     Immediately prior to procedure a time out was called: yes     Patient identity confirmation method:  Verbally with patient Indications:    Procedure performed:  Laceration repair   Procedure necessitating sedation performed by:  Physician performing sedation Pre-sedation assessment:    Time since last food or drink:  6   ASA classification: class 1 -  normal, healthy patient     Neck mobility: normal     Mouth opening:  3 or more finger widths   Thyromental distance:  4 finger widths   Mallampati score:  I - soft palate, uvula, fauces, pillars visible   Pre-sedation assessments completed and reviewed: airway patency, cardiovascular function, hydration status, mental status, nausea/vomiting, pain level, respiratory function and temperature     Pre-sedation assessment completed:  02/09/2019 3:00 PM Immediate pre-procedure details:    Reassessment: Patient reassessed immediately prior to procedure     Reviewed: vital signs, relevant labs/tests and NPO status     Verified: bag valve mask available, emergency equipment available, intubation equipment available, IV patency confirmed, oxygen available and suction available   Procedure details (see MAR for exact dosages):    Preoxygenation:  Nasal cannula   Sedation:  Propofol   Intended level of sedation: deep   Intra-procedure monitoring:  Blood pressure monitoring, cardiac monitor, continuous pulse oximetry, frequent LOC assessments, frequent vital sign checks and continuous capnometry   Intra-procedure events: none     Total Provider sedation time (minutes):  15 Post-procedure details:    Post-sedation assessment completed:  02/09/2019 3:22 PM   Attendance: Constant attendance by certified staff until patient recovered     Recovery: Patient returned to pre-procedure baseline     Post-sedation assessments completed and reviewed: airway patency, cardiovascular function, hydration status, mental status, nausea/vomiting, pain level, respiratory function and temperature     Patient is stable for discharge or admission: yes  Patient tolerance:  Tolerated well, no immediate complications    MDM  Dementia patient with hand laceration, not able to examine.  He had conscious sedation at above with Hilbert Odor, PA-C at bedside exploring wound.  Probable flexor tendon laceration wound is deep  will require evaluation in the OR.      Ezequiel Essex, MD 02/09/19 [phone]

## 2019-02-09 NOTE — ED Notes (Signed)
Spoke with Lennette Bihari RN CN at [facility] ED to discuss transfer pt to ED as requested by Short Stay. Due to volume in both hospitals and acuity he will be transferred after shift change for surgery at 2215. [facility] has been notified and will transport at 2000.

## 2019-02-09 NOTE — ED Notes (Signed)
Called [facility] PACU, was transferred to [place] and talked to [facility] who said that this patient will need to be sent to [facility] ED because OR is behind in surgeries and patient's surgery is not scheduled until 22:15 tonight.

## 2019-02-09 NOTE — Anesthesia Procedure Notes (Signed)
Procedure Name: Intubation Date/Time: 02/09/2019 9:30 PM Performed by: Shirlyn Goltz, CRNA Pre-anesthesia Checklist: Patient identified, Suction available, Emergency Drugs available and Patient being monitored Patient Re-evaluated:Patient Re-evaluated prior to induction Oxygen Delivery Method: Circle system utilized Preoxygenation: Pre-oxygenation with 100% oxygen Induction Type: IV induction and Rapid sequence Laryngoscope Size: Mac and 4 Grade View: Grade I Tube type: Oral Tube size: 7.5 mm Number of attempts: 1 Airway Equipment and Method: Stylet Placement Confirmation: ETT inserted through vocal cords under direct vision,  positive ETCO2 and breath sounds checked- equal and bilateral Secured at: 22 cm Tube secured with: Tape Dental Injury: Teeth and Oropharynx as per pre-operative assessment

## 2019-02-09 NOTE — ED Provider Notes (Signed)
[facility]    CSN: [id number] Arrival date & time: 02/09/19  1022      History   Chief Complaint Chief Complaint  Patient presents with  . Laceration    HPI Cameron West is a 63 y.o. male with history of hypertension, tobacco abuse, dementia presenting for deep right hand laceration over palm.  Patient is accompanied by his son who provides history: States patient got agitated, threw a glass, then fell and caught his hand.  Unknown tetanus.  No head trauma, LOC.  No known anticoagulant use, does take daily baby aspirin.   Past Medical History:  Diagnosis Date  . CAD (coronary artery disease)    a. 11/2016: NSTEMI: 70% mid-dist RCA stenosis with vasospasm resolving with intracoronary NTG and 95% ost D1 s/p DES  . Hypertension   . Tobacco abuse   . Traumatic brain injury [facility])    a. per son report    Patient Active Problem List   Diagnosis Date Noted  . Hyperlipidemia 12/02/2016  . CAD (coronary artery disease)   . Hypertension   . Tobacco abuse   . Traumatic brain injury ([place])   . NSTEMI (non-ST elevated myocardial infarction) ([place]) 11/23/2016    Past Surgical History:  Procedure Laterality Date  . CORONARY STENT INTERVENTION N/A 11/25/2016   Procedure: CORONARY STENT INTERVENTION;  Surgeon: Lorretta Harp, MD;  Location: [facility];  Service: Cardiovascular;  Laterality: N/A;  . LEFT HEART CATH AND CORONARY ANGIOGRAPHY N/A 11/25/2016   Procedure: LEFT HEART CATH AND CORONARY ANGIOGRAPHY;  Surgeon: Lorretta Harp, MD;  Location: [place] [facility];  Service: Cardiovascular;  Laterality: N/A;       Home Medications    Prior to Admission medications   Medication Sig Start Date End Date Taking? Authorizing Provider  aspirin 81 MG chewable tablet Chew 1 tablet (81 mg total) by mouth daily. 11/26/16   Eileen Stanford, PA-C  atorvastatin (LIPITOR) 40 MG tablet Take 1 tablet (40 mg total) by mouth daily. 01/09/18 02/09/19  Nahser, Wonda Cheng, MD   Melatonin 5 MG TABS Take 5 mg by mouth at bedtime as needed (sleep).    [provider]  memantine (NAMENDA) 10 MG tablet Take 1 tablet (10 mg total) by mouth 2 (two) times daily. 05/27/18   Star Age, MD    Family History Family History  Problem Relation Age of Onset  . Hypertension Mother     Social History Social History   Tobacco Use  . Smoking status: Former Research scientist (life sciences)  . Smokeless tobacco: Never Used  Substance Use Topics  . Alcohol use: No  . Drug use: No     Allergies   Patient has no known allergies.   Review of Systems Review of Systems  Constitutional:       ROS limited second to patient's dementia  Respiratory: Negative for shortness of breath and wheezing.   Cardiovascular: Negative for chest pain.  Neurological: Positive for weakness and numbness. Negative for dizziness and headaches.     Physical Exam Triage Vital Signs ED Triage Vitals [02/09/19 1032]  Enc Vitals Group     BP (!) 158/84     Pulse Rate 60     Resp 18     Temp 98.2 F (36.8 C)     Temp Source Temporal     SpO2 94 %     Weight      Height      Head Circumference  Peak Flow      Pain Score      Pain Loc      Pain Edu?      Excl. in GC?    No data found.  Updated Vital Signs BP (!) 158/84 (BP Location: Left Arm)   Pulse 60   Temp 98.2 F (36.8 C) (Temporal)   Resp 18   SpO2 94%   Visual Acuity Right Eye Distance:   Left Eye Distance:   Bilateral Distance:    Right Eye Near:   Left Eye Near:    Bilateral Near:     Physical Exam Constitutional:      General: He is not in acute distress.    Comments: Exam limited second to compliance due to patient's dementia  HENT:     Head: Normocephalic and atraumatic.  Eyes:     General: No scleral icterus.    Pupils: Pupils are equal, round, and reactive to light.  Cardiovascular:     Rate and Rhythm: Normal rate.  Pulmonary:     Effort: Pulmonary effort is normal.  Musculoskeletal:     Comments: Patient  is able to wiggle fingers slightly.  Endorsing "weird sensation" in index finger.   Skin:    Comments: Deep laceration over palmar aspect of right hand.  Mild oozing.  No obvious foreign body, though laceration does extend into muscle.    Neurological:     Mental Status: He is alert and oriented to person, place, and time.      UC Treatments / Results  Labs (all labs ordered are listed, but only abnormal results are displayed) Labs Reviewed - No data to display  EKG   Radiology   Procedures Procedures (including critical care time)  Medications Ordered in UC Medications - No data to display  Initial Impression / Assessment and Plan / UC Course  I have reviewed the triage vital signs and the nursing notes.  Pertinent labs & imaging results that were available during my care of the patient were reviewed by me and considered in my medical decision making (see chart for details).     See discharge instructions for MDM.  Final Clinical Impressions(s) / UC Diagnoses   Final diagnoses:  Laceration of right hand, foreign body presence unspecified, initial encounter     Discharge Instructions     63 year old male with history of dementia presenting for right hand laceration second glass injury 15 minutes PTA.  Patient endorsing decreased sensation of right index finger, though does have some range of motion, bleeding controlled.  Given severity/depth of laceration, patient was referred to ER for further evaluation/management.  Patient's wound dressed, status stable at time of discharge-to be transferred by family members.    ED Prescriptions    None     PDMP not reviewed this encounter.   Hall-Potvin, [place], [place] 02/09/19 1343

## 2019-02-09 NOTE — Anesthesia Preprocedure Evaluation (Signed)
Anesthesia Evaluation  Patient identified by MRN, date of birth, ID band Patient awake    Reviewed: Allergy & Precautions, H&P , NPO status , Patient's Chart, lab work & pertinent test results  Airway Mallampati: II   Neck ROM: full    Dental   Pulmonary former smoker,    breath sounds clear to auscultation       Cardiovascular hypertension, + CAD, + Past MI and + Cardiac Stents   Rhythm:regular Rate:Normal     Neuro/Psych    GI/Hepatic   Endo/Other    Renal/GU      Musculoskeletal   Abdominal   Peds  Hematology   Anesthesia Other Findings   Reproductive/Obstetrics                             Anesthesia Physical Anesthesia Plan  ASA: III  Anesthesia Plan: General   Post-op Pain Management:    Induction: Intravenous  PONV Risk Score and Plan: 2 and Ondansetron, Dexamethasone, Midazolam and Treatment may vary due to age or medical condition  Airway Management Planned: LMA  Additional Equipment:   Intra-op Plan:   Post-operative Plan:   Informed Consent: I have reviewed the patients History and Physical, chart, labs and discussed the procedure including the risks, benefits and alternatives for the proposed anesthesia with the patient or authorized representative who has indicated his/her understanding and acceptance.       Plan Discussed with: CRNA, Anesthesiologist and Surgeon  Anesthesia Plan Comments:         Anesthesia Quick Evaluation

## 2019-02-09 NOTE — Consult Note (Signed)
Reason for Consult:Right palmar lac Referring Physician: S Rancour  Cameron West is an 63 y.o. male.  HPI: [name] broke a glass he was carrying and it cut his palm. He was brought to the ED for evaluation. He is severely demented and cannot contribute to history.  Past Medical History:  Diagnosis Date  . CAD (coronary artery disease)    a. 11/2016: NSTEMI: 70% mid-dist RCA stenosis with vasospasm resolving with intracoronary NTG and 95% ost D1 s/p DES  . Hypertension   . Tobacco abuse   . Traumatic brain injury [facility])    a. per son report    Past Surgical History:  Procedure Laterality Date  . CORONARY STENT INTERVENTION N/A 11/25/2016   Procedure: CORONARY STENT INTERVENTION;  Surgeon: Runell Gess, MD;  [facility]: [facility];  Service: Cardiovascular;  Laterality: N/A;  . LEFT HEART CATH AND CORONARY ANGIOGRAPHY N/A 11/25/2016   Procedure: LEFT HEART CATH AND CORONARY ANGIOGRAPHY;  Surgeon: Runell Gess, MD;  Location: [facility];  Service: Cardiovascular;  Laterality: N/A;    Family History  Problem Relation Age of Onset  . Hypertension Mother     Social History:  reports that he has quit smoking. He has never used smokeless tobacco. He reports that he does not drink alcohol or use drugs.  Allergies: No Known Allergies  Medications: I have reviewed the patient's current medications.  Results for orders placed or performed during the hospital encounter of 02/09/19 (from the past 48 hour(s))  CBC with Differential/Platelet     Status: None   Collection Time: 02/09/19  1:20 PM  Result Value Ref Range   WBC 8.1 4.0 - 10.5 K/uL   RBC 4.68 4.22 - 5.81 MIL/uL   Hemoglobin 14.2 13.0 - 17.0 g/dL   HCT 23.3 00.7 - 62.2 %   MCV 93.6 80.0 - 100.0 fL   MCH 30.3 26.0 - 34.0 pg   MCHC 32.4 30.0 - 36.0 g/dL   RDW 63.3 35.4 - 56.2 %   Platelets 193 150 - 400 K/uL   nRBC 0.0 0.0 - 0.2 %   Neutrophils Relative % 69 %   Neutro Abs 5.6 1.7 - 7.7 K/uL   Lymphocytes  Relative 20 %   Lymphs Abs 1.6 0.7 - 4.0 K/uL   Monocytes Relative 10 %   Monocytes Absolute 0.8 0.1 - 1.0 K/uL   Eosinophils Relative 1 %   Eosinophils Absolute 0.1 0.0 - 0.5 K/uL   Basophils Relative 0 %   Basophils Absolute 0.0 0.0 - 0.1 K/uL   Immature Granulocytes 0 %   Abs Immature Granulocytes 0.02 0.00 - 0.07 K/uL    Comment: Performed at [facility], [address], [place], [place] [address]  Basic metabolic panel     Status: Abnormal   Collection Time: 02/09/19  1:20 PM  Result Value Ref Range   Sodium 141 135 - 145 mmol/L   Potassium 3.6 3.5 - 5.1 mmol/L   Chloride 110 98 - 111 mmol/L   CO2 23 22 - 32 mmol/L   Glucose, Bld 91 70 - 99 mg/dL   BUN 24 (H) 8 - 23 mg/dL   Creatinine, Ser 3.73 0.61 - 1.24 mg/dL   Calcium 9.3 8.9 - 42.8 mg/dL   GFR calc non Af Amer >60 >60 mL/min   GFR calc Af Amer >60 >60 mL/min   Anion gap 8 5 - 15    Comment: Performed at [facility], [place] [address] [place], [place] [address]    Dg Hand Complete Right  Result Date: 02/09/2019 CLINICAL DATA:  Laceration to palmar aspect of hand with glass EXAM: RIGHT HAND - COMPLETE 3+ VIEW COMPARISON:  None. FINDINGS: Frontal, oblique, and lateral views obtained. There is a bandage in the palmar aspect of the hand. No other radiopaque foreign body. No acute fracture or dislocation. There is a small exostosis arising from the lateral aspect of the mid scaphoid bone. A cystic areas noted in the proximal scaphoid. A second cystic areas noted in the lateral aspect of the capitate bone. There is osteoarthritic change in all PIP and DIP joints as well as in the first IP joint and third MCP joint. No erosive change. IMPRESSION: Bandage in palmar aspect of hand.  No other radiopaque foreign body. No fracture or dislocation. Osteoarthritic change at multiple levels. Electronically Signed   By: Lowella Grip III M.D.   On: 02/09/2019 12:27    Review of Systems  Unable to  perform ROS: Dementia   Blood pressure (!) 154/99, pulse (!) 55, temperature 98.3 F (36.8 C), temperature source Oral, resp. rate 16, weight 86.2 kg, SpO2 100 %. Physical Exam  Constitutional: He appears well-developed and well-nourished. No distress.  HENT:  Head: Normocephalic and atraumatic.  Eyes: Conjunctivae are normal. Right eye exhibits no discharge. Left eye exhibits no discharge. No scleral icterus.  Neck: Normal range of motion.  Cardiovascular: Normal rate and regular rhythm.  Respiratory: Effort normal. No respiratory distress.  Musculoskeletal:     Comments: Right shoulder, elbow, wrist, digits- Palmar laceration, no instability, no blocks to motion  Sens  Ax/R/M/U could not assess  Mot   Ax/ R/ PIN/ M/ AIN/ U could not assess  Rad 2+  Neurological: He is alert.  Skin: Skin is warm and dry. He is not diaphoretic.  Psychiatric: He has a normal mood and affect. His behavior is normal.    Assessment/Plan: Right palmar laceration -- Quite deep. I think it will be best for operative I&D with Dr. Amedeo Plenty this afternoon at [facility]. Keep NPO. Should be able to return home after surgery. Dementia    Lisette Abu, PA-C Orthopedic Surgery [phone] 02/09/2019, 2:09 PM

## 2019-02-09 NOTE — ED Notes (Signed)
Pts hand redressed with gauze and [place]

## 2019-02-09 NOTE — ED Triage Notes (Signed)
Pt presents to [facility] for assessment of lacertion caused by broken glass to the right palm and right wrist.  Actively bleeding at this time.  Last tetanus unknown.

## 2019-02-09 NOTE — ED Provider Notes (Signed)
[place] DEPT Provider Note   CSN: [id number] Arrival date & time: 02/09/19  1103     History   Chief Complaint Chief Complaint  Patient presents with  . Laceration    HPI Cameron West is a 63 y.o. male.     The history is provided by a relative. No language interpreter was used.  Laceration Location:  Hand Hand laceration location:  R hand Length:  4 Depth:  Through muscle Quality: straight   Pain details:    Quality:  Aching   Progression:  Worsening Foreign body present:  Unable to specify Relieved by:  Nothing Worsened by:  Nothing Tetanus status:  Unknown  Pt's daughter in law reports pt fell with a drinking glass.  Pt has a laceration to his hand.  Pt was seen at Urgent care and sent here for evaluation.  Pt has a history of a brain injury and dementia per family  Past Medical History:  Diagnosis Date  . CAD (coronary artery disease)    a. 11/2016: NSTEMI: 70% mid-dist RCA stenosis with vasospasm resolving with intracoronary NTG and 95% ost D1 s/p DES  . Hypertension   . Tobacco abuse   . Traumatic brain injury [facility])    a. per son report    Patient Active Problem List   Diagnosis Date Noted  . Hyperlipidemia 12/02/2016  . CAD (coronary artery disease)   . Hypertension   . Tobacco abuse   . Traumatic brain injury ([place])   . NSTEMI (non-ST elevated myocardial infarction) ([place]) 11/23/2016    Past Surgical History:  Procedure Laterality Date  . CORONARY STENT INTERVENTION N/A 11/25/2016   Procedure: CORONARY STENT INTERVENTION;  Surgeon: Lorretta Harp, MD;  Location: [place] [facility];  Service: Cardiovascular;  Laterality: N/A;  . LEFT HEART CATH AND CORONARY ANGIOGRAPHY N/A 11/25/2016   Procedure: LEFT HEART CATH AND CORONARY ANGIOGRAPHY;  Surgeon: Lorretta Harp, MD;  Location: [place] [facility];  Service: Cardiovascular;  Laterality: N/A;        Home Medications    Prior to Admission medications    Medication Sig Start Date End Date Taking? Authorizing Provider  aspirin 81 MG chewable tablet Chew 1 tablet (81 mg total) by mouth daily. 11/26/16  Yes Eileen Stanford, PA-C  atorvastatin (LIPITOR) 40 MG tablet Take 1 tablet (40 mg total) by mouth daily. 01/09/18 02/09/19 Yes Nahser, Wonda Cheng, MD  Melatonin 5 MG TABS Take 5 mg by mouth at bedtime as needed (sleep).   Yes [provider]  memantine (NAMENDA) 10 MG tablet Take 1 tablet (10 mg total) by mouth 2 (two) times daily. 05/27/18  Yes Star Age, MD    Family History Family History  Problem Relation Age of Onset  . Hypertension Mother     Social History Social History   Tobacco Use  . Smoking status: Former Research scientist (life sciences)  . Smokeless tobacco: Never Used  Substance Use Topics  . Alcohol use: No  . Drug use: No     Allergies   Patient has no known allergies.   Review of Systems Review of Systems  Unable to perform ROS: Dementia     Physical Exam Updated Vital Signs BP (!) 154/99 (BP Location: Right Arm)   Pulse (!) 55   Temp 98.3 F (36.8 C) (Oral)   Resp 16   Wt 86.2 kg   SpO2 100%   BMI 25.77 kg/m   Physical Exam Vitals signs reviewed.  Cardiovascular:  Rate and Rhythm: Normal rate.  Pulmonary:     Effort: Pulmonary effort is normal.  Musculoskeletal:        General: Tenderness and deformity present.     Comments: Laceration right mid palm,  Painful,   Pt will not tolerate exam. nv intact   Skin:    General: Skin is warm.  Neurological:     General: No focal deficit present.     Mental Status: He is alert.  Psychiatric:        Mood and Affect: Mood normal.      ED Treatments / Results  Labs (all labs ordered are listed, but only abnormal results are displayed) Labs Reviewed  CBC WITH DIFFERENTIAL/PLATELET  BASIC METABOLIC PANEL    EKG None  Radiology Dg Hand Complete Right  Result Date: 02/09/2019 CLINICAL DATA:  Laceration to palmar aspect of hand with glass EXAM: RIGHT  HAND - COMPLETE 3+ VIEW COMPARISON:  None. FINDINGS: Frontal, oblique, and lateral views obtained. There is a bandage in the palmar aspect of the hand. No other radiopaque foreign body. No acute fracture or dislocation. There is a small exostosis arising from the lateral aspect of the mid scaphoid bone. A cystic areas noted in the proximal scaphoid. A second cystic areas noted in the lateral aspect of the capitate bone. There is osteoarthritic change in all PIP and DIP joints as well as in the first IP joint and third MCP joint. No erosive change. IMPRESSION: Bandage in palmar aspect of hand.  No other radiopaque foreign body. No fracture or dislocation. Osteoarthritic change at multiple levels. Electronically Signed   By: Bretta Bang III M.D.   On: 02/09/2019 12:27    Procedures Procedures (including critical care time)  Medications Ordered in ED Medications  lidocaine (XYLOCAINE) 2 % injection 10 mL (has no administration in time range)  LORazepam (ATIVAN) injection 1 mg (has no administration in time range)  haloperidol lactate (HALDOL) injection 2 mg (has no administration in time range)     Initial Impression / Assessment and Plan / ED Course  I have reviewed the triage vital signs and the nursing notes.  Pertinent labs & imaging results that were available during my care of the patient were reviewed by me and considered in my medical decision making (see chart for details).        MDM  Xray shows no foreign body,  Dr. Manus Gunning in to see, limited exam, pt unable to comply with tendon testing.  I spoke with [facility].  Pt given haldol and ativan IV.  Pt resting more comfortably however he will not tolerate exam.   Conscious sedation by Dr. Manus Gunning.  Pt has deep injury with tendon laceration to flexor tendon.   Pt will be transferred to [facility] to go to Operating room   Final Clinical Impressions(s) / ED Diagnoses   Final diagnoses:  Laceration of right  hand, foreign body presence unspecified, initial encounter  Flexor tendon laceration of right hand with open wound, initial encounter    ED Discharge Orders    None    I spoke to Dr. Juleen China will accept to [facility] ED   Elson Areas, PA-C 02/09/19 1617    Rancour, Jeannett Senior, MD 02/09/19 1713

## 2019-02-09 NOTE — Discharge Instructions (Addendum)
63 year old male with history of dementia presenting for right hand laceration second glass injury 15 minutes PTA.  Patient endorsing decreased sensation of right index finger, though does have some range of motion, bleeding controlled.  Given severity/depth of laceration, patient was referred to ER for further evaluation/management.  Patient's wound dressed, status stable at time of discharge-to be transferred by family members.

## 2019-02-10 ENCOUNTER — Inpatient Hospital Stay (HOSPITAL_COMMUNITY): Payer: Medicare Other

## 2019-02-10 ENCOUNTER — Encounter (HOSPITAL_COMMUNITY): Payer: Self-pay | Admitting: Orthopedic Surgery

## 2019-02-10 DIAGNOSIS — Z751 Person awaiting admission to adequate facility elsewhere: Secondary | ICD-10-CM | POA: Diagnosis not present

## 2019-02-10 DIAGNOSIS — S6421XA Injury of radial nerve at wrist and hand level of right arm, initial encounter: Secondary | ICD-10-CM | POA: Diagnosis present

## 2019-02-10 DIAGNOSIS — I2510 Atherosclerotic heart disease of native coronary artery without angina pectoris: Secondary | ICD-10-CM | POA: Diagnosis present

## 2019-02-10 DIAGNOSIS — Z20828 Contact with and (suspected) exposure to other viral communicable diseases: Secondary | ICD-10-CM | POA: Diagnosis present

## 2019-02-10 DIAGNOSIS — I252 Old myocardial infarction: Secondary | ICD-10-CM | POA: Diagnosis not present

## 2019-02-10 DIAGNOSIS — Z7982 Long term (current) use of aspirin: Secondary | ICD-10-CM | POA: Diagnosis not present

## 2019-02-10 DIAGNOSIS — F0391 Unspecified dementia with behavioral disturbance: Secondary | ICD-10-CM | POA: Diagnosis present

## 2019-02-10 DIAGNOSIS — S55111A Laceration of radial artery at forearm level, right arm, initial encounter: Secondary | ICD-10-CM | POA: Diagnosis present

## 2019-02-10 DIAGNOSIS — R451 Restlessness and agitation: Secondary | ICD-10-CM | POA: Diagnosis not present

## 2019-02-10 DIAGNOSIS — R338 Other retention of urine: Secondary | ICD-10-CM

## 2019-02-10 DIAGNOSIS — S61411A Laceration without foreign body of right hand, initial encounter: Secondary | ICD-10-CM | POA: Diagnosis not present

## 2019-02-10 DIAGNOSIS — Z87891 Personal history of nicotine dependence: Secondary | ICD-10-CM | POA: Diagnosis not present

## 2019-02-10 DIAGNOSIS — Z66 Do not resuscitate: Secondary | ICD-10-CM | POA: Diagnosis not present

## 2019-02-10 DIAGNOSIS — E876 Hypokalemia: Secondary | ICD-10-CM | POA: Diagnosis present

## 2019-02-10 DIAGNOSIS — I160 Hypertensive urgency: Secondary | ICD-10-CM | POA: Diagnosis present

## 2019-02-10 DIAGNOSIS — F03918 Unspecified dementia, unspecified severity, with other behavioral disturbance: Secondary | ICD-10-CM | POA: Diagnosis present

## 2019-02-10 DIAGNOSIS — E785 Hyperlipidemia, unspecified: Secondary | ICD-10-CM | POA: Diagnosis present

## 2019-02-10 DIAGNOSIS — Z8249 Family history of ischemic heart disease and other diseases of the circulatory system: Secondary | ICD-10-CM | POA: Diagnosis not present

## 2019-02-10 DIAGNOSIS — S41119A Laceration without foreign body of unspecified upper arm, initial encounter: Secondary | ICD-10-CM | POA: Diagnosis present

## 2019-02-10 DIAGNOSIS — Y92009 Unspecified place in unspecified non-institutional (private) residence as the place of occurrence of the external cause: Secondary | ICD-10-CM | POA: Diagnosis not present

## 2019-02-10 DIAGNOSIS — Z79899 Other long term (current) drug therapy: Secondary | ICD-10-CM | POA: Diagnosis not present

## 2019-02-10 DIAGNOSIS — Z955 Presence of coronary angioplasty implant and graft: Secondary | ICD-10-CM | POA: Diagnosis not present

## 2019-02-10 DIAGNOSIS — W01110A Fall on same level from slipping, tripping and stumbling with subsequent striking against sharp glass, initial encounter: Secondary | ICD-10-CM | POA: Diagnosis present

## 2019-02-10 DIAGNOSIS — Z87820 Personal history of traumatic brain injury: Secondary | ICD-10-CM | POA: Diagnosis not present

## 2019-02-10 DIAGNOSIS — R339 Retention of urine, unspecified: Secondary | ICD-10-CM | POA: Diagnosis present

## 2019-02-10 DIAGNOSIS — S66821A Laceration of other specified muscles, fascia and tendons at wrist and hand level, right hand, initial encounter: Secondary | ICD-10-CM | POA: Diagnosis present

## 2019-02-10 DIAGNOSIS — Z515 Encounter for palliative care: Secondary | ICD-10-CM | POA: Diagnosis not present

## 2019-02-10 LAB — TROPONIN I (HIGH SENSITIVITY): Troponin I (High Sensitivity): 10 ng/L (ref <18)

## 2019-02-10 LAB — GLUCOSE, CAPILLARY: Glucose-Capillary: 93 mg/dL (ref 70–99)

## 2019-02-10 MED ORDER — MEMANTINE HCL 10 MG PO TABS
10.0000 mg | ORAL_TABLET | Freq: Two times a day (BID) | ORAL | Status: DC
  Administered 2019-02-11 – 2019-02-26 (×31): 10 mg via ORAL
  Filled 2019-02-10 (×31): qty 1

## 2019-02-10 MED ORDER — HALOPERIDOL LACTATE 5 MG/ML IJ SOLN
INTRAMUSCULAR | Status: AC
  Filled 2019-02-10: qty 1

## 2019-02-10 MED ORDER — HALOPERIDOL LACTATE 5 MG/ML IJ SOLN
2.0000 mg | Freq: Four times a day (QID) | INTRAMUSCULAR | Status: DC | PRN
  Administered 2019-02-10 – 2019-02-11 (×2): 2 mg via INTRAVENOUS
  Administered 2019-02-12 – 2019-02-17 (×5): 5 mg via INTRAVENOUS
  Administered 2019-02-18: 3 mg via INTRAVENOUS
  Administered 2019-02-19 – 2019-02-23 (×4): 5 mg via INTRAVENOUS
  Administered 2019-02-24: 3 mg via INTRAVENOUS
  Administered 2019-02-24 – 2019-02-25 (×2): 5 mg via INTRAVENOUS
  Filled 2019-02-10 (×16): qty 1

## 2019-02-10 MED ORDER — HYDROCODONE-ACETAMINOPHEN 7.5-325 MG PO TABS: 1.0000 | ORAL_TABLET | ORAL | Status: DC | PRN

## 2019-02-10 MED ORDER — MORPHINE SULFATE (PF) 2 MG/ML IV SOLN: 1.0000 mg | INTRAVENOUS | Status: DC | PRN

## 2019-02-10 MED ORDER — FAMOTIDINE 20 MG PO TABS: 20.0000 mg | ORAL_TABLET | Freq: Two times a day (BID) | ORAL | Status: DC | PRN

## 2019-02-10 MED ORDER — HALOPERIDOL LACTATE 5 MG/ML IJ SOLN
2.0000 mg | Freq: Once | INTRAMUSCULAR | Status: AC
  Administered 2019-02-10: 2 mg via INTRAVENOUS

## 2019-02-10 MED ORDER — ACETAMINOPHEN 10 MG/ML IV SOLN
1000.0000 mg | Freq: Once | INTRAVENOUS | Status: AC
  Administered 2019-02-10: 1000 mg via INTRAVENOUS
  Filled 2019-02-10: qty 100

## 2019-02-10 MED ORDER — ATORVASTATIN CALCIUM 40 MG PO TABS
40.0000 mg | ORAL_TABLET | Freq: Every day | ORAL | Status: DC
  Administered 2019-02-11 – 2019-02-26 (×16): 40 mg via ORAL
  Filled 2019-02-10 (×16): qty 1

## 2019-02-10 MED ORDER — HYDRALAZINE HCL 20 MG/ML IJ SOLN
10.0000 mg | Freq: Once | INTRAMUSCULAR | Status: AC
  Administered 2019-02-10: 10 mg via INTRAVENOUS
  Filled 2019-02-10: qty 1

## 2019-02-10 MED ORDER — CEFAZOLIN SODIUM-DEXTROSE 1-4 GM/50ML-% IV SOLN
1.0000 g | Freq: Three times a day (TID) | INTRAVENOUS | Status: DC
  Administered 2019-02-10 – 2019-02-12 (×7): 1 g via INTRAVENOUS
  Filled 2019-02-10 (×10): qty 50

## 2019-02-10 MED ORDER — MORPHINE SULFATE (PF) 2 MG/ML IV SOLN: 0.5000 mg | INTRAVENOUS | Status: DC | PRN

## 2019-02-10 MED ORDER — ONDANSETRON HCL 4 MG PO TABS: 4.0000 mg | ORAL_TABLET | Freq: Four times a day (QID) | ORAL | Status: DC | PRN

## 2019-02-10 MED ORDER — HYDROCODONE-ACETAMINOPHEN 5-325 MG PO TABS: 1.0000 | ORAL_TABLET | ORAL | Status: DC | PRN

## 2019-02-10 MED ORDER — ONDANSETRON HCL 4 MG/2ML IJ SOLN: 4.0000 mg | Freq: Four times a day (QID) | INTRAMUSCULAR | Status: DC | PRN

## 2019-02-10 MED ORDER — LORAZEPAM 2 MG/ML IJ SOLN
1.0000 mg | Freq: Once | INTRAMUSCULAR | Status: AC
  Administered 2019-02-10: 1 mg via INTRAVENOUS
  Filled 2019-02-10: qty 1

## 2019-02-10 MED ORDER — ACETAMINOPHEN 500 MG PO TABS: 500.0000 mg | ORAL_TABLET | Freq: Four times a day (QID) | ORAL | Status: AC

## 2019-02-10 MED ORDER — ACETAMINOPHEN 325 MG PO TABS: 325.0000 mg | ORAL_TABLET | Freq: Four times a day (QID) | ORAL | Status: DC | PRN

## 2019-02-10 MED ORDER — LORAZEPAM 2 MG/ML IJ SOLN
0.5000 mg | INTRAMUSCULAR | Status: DC | PRN
  Administered 2019-02-12 – 2019-02-25 (×6): 0.5 mg via INTRAVENOUS
  Filled 2019-02-10 (×6): qty 1

## 2019-02-10 MED ORDER — HYDROCODONE-ACETAMINOPHEN 5-325 MG PO TABS: 1.0000 | ORAL_TABLET | Freq: Four times a day (QID) | ORAL | Status: DC | PRN

## 2019-02-10 MED ORDER — CEFAZOLIN SODIUM-DEXTROSE 1-4 GM/50ML-% IV SOLN
1.0000 g | INTRAVENOUS | Status: AC
  Administered 2019-02-10: 1 g via INTRAVENOUS
  Filled 2019-02-10: qty 50

## 2019-02-10 MED ORDER — VITAMIN C 500 MG PO TABS
1000.0000 mg | ORAL_TABLET | Freq: Every day | ORAL | Status: DC
  Administered 2019-02-11 – 2019-02-26 (×16): 1000 mg via ORAL
  Filled 2019-02-10 (×16): qty 2

## 2019-02-10 NOTE — Progress Notes (Signed)
D; Pt was very agitated  Tried to getting out of bed. incontinent urine ,also kept saying need to go to bathroom. Low abdomen tender. Bladder scan done, > 673ml. Notified Dr. Amedeo Plenty regarding very agitated, high BP, Bladder scan result. I &O cath order received.@ 2330 There was resistance, unable to I&O cath. Call Dr. Amedeo Plenty Back to get order for coudae I&O cath and con dome cath order received.  825cc urine obtained strong smell urine. Pt calm down.

## 2019-02-10 NOTE — Progress Notes (Signed)
Pt has had 4 urine occurrences. After changing pt a condom cath was placed on the pt in order to prevent the pt from wetting the bed.

## 2019-02-10 NOTE — Progress Notes (Addendum)
MEWS Guidelines - (patients age 61 and over)  Red - At High Risk for Deterioration Yellow - At risk for Deterioration  1. Go to room and assess patient 2. Validate data. Is this patient's baseline? If data confirmed: 3. Is this an acute change? 4. Administer prn meds/treatments as ordered. 5. Note Sepsis score 6. Review goals of care 7. Sports coach, RRT nurse and Provider. 8. Ask Provider to come to bedside.  9. Document patient condition/interventions/response. 10. Increase frequency of vital signs and focused assessments to at least q15 minutes x 4, then q30 minutes x2. - If stable, then q1h x3, then q4h x3 and then q8h or dept. routine. - If unstable, contact Provider & RRT nurse. Prepare for possible transfer. 11. Add entry in progress notes using the smart phrase ".MEWS". 1. Go to room and assess patient 2. Validate data. Is this patient's baseline? If data confirmed: 3. Is this an acute change? 4. Administer prn meds/treatments as ordered? 5. Note Sepsis score 6. Review goals of care 7. Sports coach and Provider 8. Call RRT nurse as needed. 9. Document patient condition/interventions/response. 10. Increase frequency of vital signs and focused assessments to at least q2h x2. - If stable, then q4h x2 and then q8h or dept. routine. - If unstable, contact Provider & RRT nurse. Prepare for possible transfer. 11. Add entry in progress notes using the smart phrase ".MEWS".  Green - Likely stable Lavender - Comfort Care Only  1. Continue routine/ordered monitoring.  2. Review goals of care. 1. Continue routine/ordered monitoring. 2. Review goals of care.    Yellow Not an acute change pt Blood pressure is abnormal

## 2019-02-10 NOTE — Progress Notes (Addendum)
Pt presented to the floor very agitated and confused. Continues to attempt to get out of bed, continues to pull at his dressing and IV. High fall risk at this time. Pt continues to say that he has to use the bathroom, however after a number of attempts using the urinal he is still unable to. Dr paged 2 mg of Haldol ordered, pt continued to get out of bed. Unable to obtain any information from pt at this time. Unable to educate pt at this time. Call bell was placed in pt's reach. Dr page concerning the pt's BP and continued attempts to get out of bed. Skin checked, no issues to report. Will continue to monitor pt throughout the shift.  Unable to get x-ray, labs, or EKG due to pt agitation at this time.

## 2019-02-10 NOTE — Progress Notes (Signed)
Patient ID: Cameron West, male   DOB: 04/04/1956, 63 y.o.   MRN: [id number]  PROGRESS NOTE    Cameron West  DJM:[id number] DOB: Aug 21, 1955 DOA: 02/09/2019 PCP: Lizbeth Bark, FNP   Brief Narrative:  63 year old male with history of advanced dementia, traumatic brain injury, CAD status post PCI, hypertension, hyperlipidemia presented with hand injury.  Hand surgery was consulted and he underwent irrigation and debridement, radial digital nerve repair and artery repair and complex wound closure of the first webspace of the right hand on 02/09/2019.  Assessment & Plan:   Laceration of right hand -Status post  irrigation and debridement, radial digital nerve repair and artery repair and complex wound closure of the first webspace of the right hand on 02/09/2019. -Hand surgery following.  Wound care and antibiotics as per surgeon  Hypertensive urgency -Blood pressure improving but still on the high side.  Will use as needed antihypertensives for now as his mental status has not improved much.  Advanced dementia Agitation -We will use Haldol/Ativan as needed for agitation.  Palliative care evaluation for goals of care discussion.  Continue memantine. -PT/OT eval once patient is more awake -Might need placement  Acute urinary retention -Continue in and out catheterization for now.  May need Foley if continues to have urinary retention  Hyperlipidemia -Continue statin.    DVT prophylaxis: SCDs Code Status: Full Family Communication: None at bedside Disposition Plan: Might need placement  Consultants: Hand surgery  Procedures:   irrigation and debridement, radial digital nerve repair and artery repair and complex wound closure of the first webspace of the right hand on 02/09/2019.  Antimicrobials:  Anti-infectives (From admission, onward)   Start     Dose/Rate Route Frequency Ordered Stop   02/10/19 0600  ceFAZolin (ANCEF) IVPB 2g/100 mL premix  Status:  Discontinued     2 g 200  mL/hr over 30 Minutes Intravenous On call to O.R. 02/09/19 1955 02/10/19 0028   02/10/19 0600  ceFAZolin (ANCEF) IVPB 1 g/50 mL premix     1 g 100 mL/hr over 30 Minutes Intravenous Every 8 hours 02/10/19 0144     02/10/19 0030  ceFAZolin (ANCEF) IVPB 1 g/50 mL premix     1 g 100 mL/hr over 30 Minutes Intravenous NOW 02/10/19 0027 02/10/19 0243       Subjective: Patient seen and examined at bedside.  He is sleepy, hardly wakes up and calling his name.  Nursing staff reports episodes of intermittent agitation.  Objective: Vitals:   02/10/19 0607 02/10/19 0757 02/10/19 0808 02/10/19 1140  BP: (!) 153/95  (!) 152/98 (!) 155/96  Pulse:   80 78  Resp:   19 18  Temp:  98.2 F (36.8 C)  98.3 F (36.8 C)  TempSrc:  Skin  Skin  SpO2: 100%  99% 100%  Weight:        Intake/Output Summary (Last 24 hours) at 02/10/2019 1516 Last data filed at 02/10/2019 2229 Gross per 24 hour  Intake 1250 ml  Output 1430 ml  Net -180 ml   Filed Weights   02/09/19 1123  Weight: 86.2 kg    Examination:  General exam: Appears sleepy, hardly wakes up on calling his name. Respiratory system: Bilateral decreased breath sounds at bases Cardiovascular system: S1 & S2 heard, Rate controlled Gastrointestinal system: Abdomen is nondistended, soft and nontender. Normal bowel sounds heard. Extremities: No cyanosis, clubbing, edema.  Right hand dressing present Central nervous system: Sleepy. No focal neurological deficits. Moving extremities Skin:  No rashes, lesions or ulcers Psychiatry: Could not be assessed because of mental status.   Data Reviewed: I have personally reviewed following labs and imaging studies  CBC: Recent Labs  Lab 02/09/19 1320  WBC 8.1  NEUTROABS 5.6  HGB 14.2  HCT 43.8  MCV 93.6  PLT 947   Basic Metabolic Panel: Recent Labs  Lab 02/09/19 1320  NA 141  K 3.6  CL 110  CO2 23  GLUCOSE 91  BUN 24*  CREATININE 0.61  CALCIUM 9.3   GFR: CrCl cannot be calculated  (Unknown ideal weight.). Liver Function Tests: No results for input(s): AST, ALT, ALKPHOS, BILITOT, PROT, ALBUMIN in the last 168 hours. No results for input(s): LIPASE, AMYLASE in the last 168 hours. No results for input(s): AMMONIA in the last 168 hours. Coagulation Profile: No results for input(s): INR, PROTIME in the last 168 hours. Cardiac Enzymes: No results for input(s): CKTOTAL, CKMB, CKMBINDEX, TROPONINI in the last 168 hours. BNP (last 3 results) No results for input(s): PROBNP in the last 8760 hours. HbA1C: No results for input(s): HGBA1C in the last 72 hours. CBG: Recent Labs  Lab 02/10/19 1154  GLUCAP 93   Lipid Profile: No results for input(s): CHOL, HDL, LDLCALC, TRIG, CHOLHDL, LDLDIRECT in the last 72 hours. Thyroid Function Tests: No results for input(s): TSH, T4TOTAL, FREET4, T3FREE, THYROIDAB in the last 72 hours. Anemia Panel: No results for input(s): VITAMINB12, FOLATE, FERRITIN, TIBC, IRON, RETICCTPCT in the last 72 hours. Sepsis Labs: No results for input(s): PROCALCITON, LATICACIDVEN in the last 168 hours.  Recent Results (from the past 240 hour(s))  SARS Coronavirus 2 by RT PCR (hospital order, performed in [facility] hospital lab) Nasopharyngeal Nasopharyngeal Swab     Status: None   Collection Time: 02/09/19  3:31 PM   Specimen: Nasopharyngeal Swab  Result Value Ref Range Status   SARS Coronavirus 2 NEGATIVE NEGATIVE Final    Comment: (NOTE) If result is NEGATIVE SARS-CoV-2 target nucleic acids are NOT DETECTED. The SARS-CoV-2 RNA is generally detectable in upper and lower  respiratory specimens during the acute phase of infection. The lowest  concentration of SARS-CoV-2 viral copies this assay can detect is 250  copies / mL. A negative result does not preclude SARS-CoV-2 infection  and should not be used as the sole basis for treatment or other  patient management decisions.  A negative result may occur with  improper specimen collection /  handling, submission of specimen other  than nasopharyngeal swab, presence of viral mutation(s) within the  areas targeted by this assay, and inadequate number of viral copies  (<250 copies / mL). A negative result must be combined with clinical  observations, patient history, and epidemiological information. If result is POSITIVE SARS-CoV-2 target nucleic acids are DETECTED. The SARS-CoV-2 RNA is generally detectable in upper and lower  respiratory specimens dur ing the acute phase of infection.  Positive  results are indicative of active infection with SARS-CoV-2.  Clinical  correlation with patient history and other diagnostic information is  necessary to determine patient infection status.  Positive results do  not rule out bacterial infection or co-infection with other viruses. If result is PRESUMPTIVE POSTIVE SARS-CoV-2 nucleic acids MAY BE PRESENT.   A presumptive positive result was obtained on the submitted specimen  and confirmed on repeat testing.  While 2019 novel coronavirus  (SARS-CoV-2) nucleic acids may be present in the submitted sample  additional confirmatory testing may be necessary for epidemiological  and / or clinical management  purposes  to differentiate between  SARS-CoV-2 and other Sarbecovirus currently known to infect humans.  If clinically indicated additional testing with an alternate test  methodology [phone]([id number]) is advised. The SARS-CoV-2 RNA is generally  detectable in upper and lower respiratory sp ecimens during the acute  phase of infection. The expected result is Negative. Fact Sheet for Patients:  BoilerBrush.com.cyhttps://www.fda.gov/media/136312/download Fact Sheet for Healthcare Providers: https://pope.com/https://www.fda.gov/media/136313/download This test is not yet approved or cleared by the [place] FDA and has been authorized for detection and/or diagnosis of SARS-CoV-2 by FDA under an Emergency Use Authorization (EUA).  This EUA will remain in effect (meaning this  test can be used) for the duration of the COVID-19 declaration under Section 564(b)(1) of the Act, 21 U.S.C. section 360bbb-3(b)(1), unless the authorization is terminated or revoked sooner. Performed at [facility], [address] [place], [place] [address]          Radiology Studies: Dg Chest Port 1 View  Result Date: 02/10/2019 CLINICAL DATA:  Hypertension EXAM: PORTABLE CHEST 1 VIEW COMPARISON:  11/23/2016 FINDINGS: Cardiomegaly. Both lungs are clear. The visualized skeletal structures are unremarkable. IMPRESSION: Cardiomegaly without acute abnormality of the lungs in AP portable projection. Electronically Signed   By: Lauralyn PrimesAlex  Bibbey M.D.   On: 02/10/2019 10:13   Dg Hand Complete Right  Result Date: 02/09/2019 CLINICAL DATA:  Laceration to palmar aspect of hand with glass EXAM: RIGHT HAND - COMPLETE 3+ VIEW COMPARISON:  None. FINDINGS: Frontal, oblique, and lateral views obtained. There is a bandage in the palmar aspect of the hand. No other radiopaque foreign body. No acute fracture or dislocation. There is a small exostosis arising from the lateral aspect of the mid scaphoid bone. A cystic areas noted in the proximal scaphoid. A second cystic areas noted in the lateral aspect of the capitate bone. There is osteoarthritic change in all PIP and DIP joints as well as in the first IP joint and third MCP joint. No erosive change. IMPRESSION: Bandage in palmar aspect of hand.  No other radiopaque foreign body. No fracture or dislocation. Osteoarthritic change at multiple levels. Electronically Signed   By: Bretta BangWilliam  Woodruff III M.D.   On: 02/09/2019 12:27        Scheduled Meds: . acetaminophen  500 mg Oral Q6H  . atorvastatin  40 mg Oral Daily  . memantine  10 mg Oral BID  . vitamin C  1,000 mg Oral Daily   Continuous Infusions: .  ceFAZolin (ANCEF) IV 1 g (02/10/19 0624)  . lactated ringers 50 mL/hr at 02/10/19 1000          Glade LloydKshitiz Sharnetta Gielow, MD [facility] 02/10/2019, 3:16 PM

## 2019-02-10 NOTE — Evaluation (Addendum)
PT Cancellation Note  Patient Details Name: Cameron West MRN: [id number] DOB: [birth year]   Cancelled Treatment:    Reason Eval/Treat Not Completed: Patient's level of consciousness- attempted to rouse. Unable. Will return later.  Re-attempted this pm, patient continues to be lethargic. Will re-attempt evaluation tomorrow.    Merve Hotard 02/10/2019, 11:27 AM

## 2019-02-10 NOTE — Evaluation (Signed)
Clinical/Bedside Swallow Evaluation Patient Details  Name: Cameron West MRN: [id number] Date of Birth: [birth year]  Today's Date: 02/10/2019 Time: SLP Start Time (ACUTE ONLY): 1151 SLP Stop Time (ACUTE ONLY): 1205 SLP Time Calculation (min) (ACUTE ONLY): 14 min  Past Medical History:  Past Medical History:  Diagnosis Date  . CAD (coronary artery disease)    a. 11/2016: NSTEMI: 70% mid-dist RCA stenosis with vasospasm resolving with intracoronary NTG and 95% ost D1 s/p DES  . Hypertension   . Tobacco abuse   . Traumatic brain injury [facility])    a. per son report   Past Surgical History:  Past Surgical History:  Procedure Laterality Date  . CORONARY STENT INTERVENTION N/A 11/25/2016   Procedure: CORONARY STENT INTERVENTION;  Surgeon: Runell Gess, MD;  Location: [facility] INVASIVE CV LAB;  Service: Cardiovascular;  Laterality: N/A;  . LEFT HEART CATH AND CORONARY ANGIOGRAPHY N/A 11/25/2016   Procedure: LEFT HEART CATH AND CORONARY ANGIOGRAPHY;  Surgeon: Runell Gess, MD;  Location: [facility] INVASIVE CV LAB;  Service: Cardiovascular;  Laterality: N/A;   HPI:  Cameron West is a 63 y.o. male with medical history significant of advanced dementia, traumatic brain injury, CAD status post PCI, hypertension, hyperlipidemia presented to the ED for evaluation of hand injury.  Family reported that patient fell while holding a drinking glass and lacerated his hand.  Now s/p surgical repair. CXR 11/4: lungs clear.   Assessment / Plan / Recommendation Clinical Impression  Pt tolerated all consistencies trialed with no clinical s/s of aspiration.  Pt was very lethargic and minimally responsive during evaluation. OME could not be completed.  Pt accepted ice chip by spoon.  After prolonged oral phase and suspected oral holding, phayrngeal swallow reflex was palpated.  Oral phase more efficient with thin liquid by spoon and puree by spoon.    Recommend pt remain NPO at present base on level of arousal and decreased  participative; however, pt appears safe to have priority medications crushed in applesauce if needed.   SLP to follow up with PO trials to determine when pt is appropriate to initiation PO diet or if further instrumental testing is needed.  SLP Visit Diagnosis: Dysphagia, unspecified (R13.10)    Aspiration Risk  Mild aspiration risk    Diet Recommendation NPO except meds   Medication Administration: Crushed with puree    Other  Recommendations Oral Care Recommendations: Oral care QID   Follow up Recommendations (TBD)      Frequency and Duration min 2x/week  2 weeks       Prognosis Prognosis for Safe Diet Advancement: Good Barriers to Reach Goals: Cognitive deficits      Swallow Study   General HPI: Cameron West is a 63 y.o. male with medical history significant of advanced dementia, traumatic brain injury, CAD status post PCI, hypertension, hyperlipidemia presented to the ED for evaluation of hand injury.  Family reported that patient fell while holding a drinking glass and lacerated his hand.  Now s/p surgical repair. CXR 11/4: lungs clear. Type of Study: Bedside Swallow Evaluation Previous Swallow Assessment: None Diet Prior to this Study: NPO Temperature Spikes Noted: No History of Recent Intubation: Yes Length of Intubations (days): (for procedure) Behavior/Cognition: Lethargic/Drowsy;Doesn't follow directions Oral Cavity Assessment: (Unable to assess) Oral Care Completed by SLP: No Oral Cavity - Dentition: Adequate natural dentition(anterior oral cavity) Patient Positioning: Upright in bed Baseline Vocal Quality: Not observed Volitional Cough: Cognitively unable to elicit Volitional Swallow: Unable to elicit  Oral/Motor/Sensory Function Overall Oral Motor/Sensory Function: (Unable to assess)   Ice Chips Ice chips: Impaired Presentation: Spoon Oral Phase Functional Implications: Oral holding   Thin Liquid Thin Liquid: Within functional limits Presentation: Spoon     Nectar Thick Nectar Thick Liquid: Not tested   Honey Thick Honey Thick Liquid: Not tested   Puree Puree: Within functional limits Presentation: Spoon   Solid     Solid: Not tested      Cameron West, [place], [place] Office: [phone] 02/10/2019,12:18 PM

## 2019-02-10 NOTE — TOC Initial Note (Addendum)
Transition of Care [facility]) - Initial/Assessment Note    Patient Details  Name: Cameron West MRN: [id number] Date of Birth: 1955/08/02  [facility] CM/SW Contact:    Sharin Mons, RN Phone Number: 02/10/2019, 2:53 PM  Clinical Narrative:      Pt admitted s/p fall, suffered R hand laceration. Underwent irrigation and debridement with right hand laceration  repair.  Hx of advanced dementia, traumatic brain injury, CAD status post PCI, hypertension, hyperlipidemia.   From home with adult  son Elberta Fortis states son Andrez. Tavin states pt has 24/7 supervision from family, primarily son Elberta Fortis. Per son Tarry ( primary point of contact) , pt has required some assistance with ADL's within the past few months and has become taxing to care for @ home. States did apply for Medicaid PCS hrs but was denied this year. Requesting LTC facility for pt.  Vira Browns. (Son)     [phone]        NCM received consult for LTC placement  ... PT evaluation pending   TOC team monitoring for needs.....     Expected Discharge Plan: Long Term Nursing Home Barriers to Discharge: Continued Medical Work up   Patient Goals and CMS Choice        Expected Discharge Plan and Services Expected Discharge Plan: [place]   Prior Living Arrangements/Services  Activities of Daily Living      Permission Sought/Granted      Emotional Assessment              Admission diagnosis:  Flexor tendon laceration of right hand with open wound, initial encounter [C12.751Z, S61.401A] Laceration of right hand, foreign body presence unspecified, initial encounter [S61.411A] Laceration of arm [S41.119A] Patient Active Problem List   Diagnosis Date Noted  . Hypertensive urgency 02/10/2019  . Acute urinary retention 02/10/2019  . Dementia ([place]) 02/10/2019  . Laceration of arm 02/10/2019  . Laceration of right hand 02/09/2019  . Hyperlipidemia 12/02/2016  . CAD (coronary artery  disease)   . Hypertension   . Tobacco abuse   . Traumatic brain injury ([place])   . NSTEMI (non-ST elevated myocardial infarction) ([place]) 11/23/2016   PCP:  Alfonse Spruce, FNP Pharmacy:   [facility] [place], [place] - [address] [place] [address] Phone: [phone] Fax: [phone]     Social Determinants of Health (SDOH) Interventions    Readmission Risk Interventions No flowsheet data found.

## 2019-02-10 NOTE — Evaluation (Signed)
Occupational Therapy Evaluation Patient Details Name: Cameron West MRN: [id number] DOB: [birth year] Today's Date: 02/10/2019    History of Present Illness  63 y.o. male with medical history significant of advanced dementia, traumatic brain injury, CAD status post PCI, hypertension, hyperlipidemia presented to the ED for evaluation of hand injury. S/p irrigation debridement and digital nerve and artery repair with irrigation and debridement right hand.    Clinical Impression   PTA, pt was living with his adult son and was requiring increased assistance per chart review. Pt currently requiring Min A for mobility and OOB activity due to poor balance and safety. Throughout ADLs and mobility, pt requiring Max cues. Pt presenting with poor balance, safety awareness, and cognition for safety performance of BADLs. Pending support level at home, pt may require SNF for increased assistance and further OT. Will continue to follow acutely as admitted to facilitate safe dc.     Follow Up Recommendations  Home health OT;SNF;Supervision/Assistance - 24 hour(Will require 24/7 assist, unsure of home support)    Equipment Recommendations  Other (comment)(Unsure of DME at home)    Recommendations for Other Services       Precautions / Restrictions Precautions Precautions: Fall Restrictions Weight Bearing Restrictions: Yes RUE Weight Bearing: Weight bear through elbow only Other Position/Activity Restrictions: Verbal order per Amanda Pea, MD      Mobility Bed Mobility Overal bed mobility: Needs Assistance Bed Mobility: Sit to Supine       Sit to supine: Supervision   General bed mobility comments: Supervision for safety. No physical A needed.  Transfers Overall transfer level: Needs assistance   Transfers: Sit to/from Stand Sit to Stand: Min assist         General transfer comment: Min A to power up into standing and to gain balance.     Balance Overall balance assessment: Needs  assistance Sitting-balance support: No upper extremity supported;Feet supported Sitting balance-Leahy Scale: Fair     Standing balance support: No upper extremity supported;During functional activity Standing balance-Leahy Scale: Poor Standing balance comment: Reliant on Min A for standing balance                           ADL either performed or assessed with clinical judgement   ADL Overall ADL's : Needs assistance/impaired       Grooming Details (indicate cue type and reason): Cueing pt to wash his face while standing at sink. Pt turning on water and then leaning over to drink from faucet. Pt not following cues for washing face.                              Functional mobility during ADLs: Minimal assistance General ADL Comments: Due to decreased cognition, pt requiring Max cues throughout ADLs. With mobility and OOB activity, pt requiring Min A for balance and safety.     Vision         Perception     Praxis      Pertinent Vitals/Pain Pain Assessment: Faces Faces Pain Scale: No hurt Pain Intervention(s): Monitored during session     Hand Dominance     Extremity/Trunk Assessment Upper Extremity Assessment Upper Extremity Assessment: RUE deficits/detail RUE Deficits / Details:  S/p irrigation debridement and digital nerve and artery repair with irrigation and debridement right hand secondary to hand laceration. NWB through hand; elbow only. Wrapped in ace wrap with splint support. RUE Coordination: decreased  fine motor   Lower Extremity Assessment Lower Extremity Assessment: Defer to PT evaluation   Cervical / Trunk Assessment Cervical / Trunk Assessment: Normal   Communication Communication Communication: Other (comment)(Speaking few words during session)   Cognition Arousal/Alertness: Awake/alert Behavior During Therapy: Flat affect Overall Cognitive Status: History of cognitive impairments - at baseline                                  General Comments: Baseline TBI and dementia. Pt requiring significant time and cues for performing ADLs and safety. Upon arrival, pt standing in middle of room with bed alarm going off and lines twisted. Pt repeating "bathroom." When asking pt to wash his face (while at sink), pt turning on water and then leaning over to drink from it.    General Comments  VSS throughout    Exercises     Shoulder Instructions      Home Living Family/patient expects to be discharged to:: Unsure                                 Additional Comments: Per chart review, pt lives with his son and has been requiring increased assistance lately.       Prior Functioning/Environment Level of Independence: Needs assistance        Comments: Unsure of assistance for ADLs at baseline        OT Problem List: Decreased range of motion;Decreased activity tolerance;Impaired balance (sitting and/or standing);Decreased safety awareness;Decreased cognition;Decreased knowledge of precautions;Impaired UE functional use;Pain      OT Treatment/Interventions: Self-care/ADL training;Therapeutic exercise;Energy conservation;DME and/or AE instruction;Therapeutic activities;Patient/family education    OT Goals(Current goals can be found in the care plan section) Acute Rehab OT Goals Patient Stated Goal: Not stated OT Goal Formulation: Patient unable to participate in goal setting Time For Goal Achievement: 02/24/19 Potential to Achieve Goals: Good  OT Frequency: Min 2X/week   Barriers to D/C:            Co-evaluation              AM-PAC OT "6 Clicks" Daily Activity     Outcome Measure Help from another person eating meals?: A Little Help from another person taking care of personal grooming?: A Little Help from another person toileting, which includes using toliet, bedpan, or urinal?: A Little Help from another person bathing (including washing, rinsing, drying)?: A  Little Help from another person to put on and taking off regular upper body clothing?: A Little Help from another person to put on and taking off regular lower body clothing?: A Little 6 Click Score: 18   End of Session Equipment Utilized During Treatment: Gait belt Nurse Communication: Mobility status  Activity Tolerance: Patient tolerated treatment well Patient left: in bed;with call bell/phone within reach;with bed alarm set;with restraints reapplied  OT Visit Diagnosis: Unsteadiness on feet (R26.81);Other abnormalities of gait and mobility (R26.89);Muscle weakness (generalized) (M62.81);Other symptoms and signs involving cognitive function                Time: 2993-7169 OT Time Calculation (min): 11 min Charges:  OT General Charges $OT Visit: 1 Visit OT Evaluation $OT Eval Moderate Complexity: [place], OTR/L Acute Rehab Pager: [phone] Office: [place] 02/10/2019, 3:43 PM

## 2019-02-10 NOTE — Progress Notes (Addendum)
NCM received consult: Please see and evaluate for long-term placement. This patient will likely need a nursing home facility. He is struggled with dementia for quite some time. The family is very interested in this option as it has been very taxing on them. I spoke with the son who is working out of town and will try to pick up his cell phone but did state that his significant other has family permission to make any decision regarding long-term placement. Thanks Dr. Amedeo Plenty.  PT evaluation pending .... NCM will follow up  TOC needs.  Whitman Hero RN,BSN,CM [phone]

## 2019-02-10 NOTE — Progress Notes (Signed)
Patient ID: Cameron West, male   DOB: 02-Jan-1956, 63 y.o.   MRN: [id number] Patient seen at bedside.  Patient had a difficult night with agitation.  This is not expected.  At present juncture the patient's right hand is stable.  He has his bandage intact.  I discussed with nursing staff at bedside it is okay to reinforce the bandage but to keep it on.  Unfortunately when he has to urinate he gets very agitated.  This is been the biggest issue in obstacle last night.  As discussed with his son over the telephone he is quite demented and although has been trying to live at home I question whether we need to consider a dedicated facility for this gentleman.  Hopefully we can get social work involved.  We will continue postop observation.  Status post irrigation debridement and digital nerve and artery repair with irrigation and debridement right hand secondary to laceration November third 2020  Tong Pieczynski [phone]

## 2019-02-11 DIAGNOSIS — Z66 Do not resuscitate: Secondary | ICD-10-CM

## 2019-02-11 DIAGNOSIS — Z515 Encounter for palliative care: Secondary | ICD-10-CM

## 2019-02-11 LAB — CBC WITH DIFFERENTIAL/PLATELET
Abs Immature Granulocytes: 0.02 10*3/uL (ref 0.00–0.07)
Basophils Absolute: 0 10*3/uL (ref 0.0–0.1)
Basophils Relative: 0 %
Eosinophils Absolute: 0 10*3/uL (ref 0.0–0.5)
Eosinophils Relative: 0 %
HCT: 39.9 % (ref 39.0–52.0)
Hemoglobin: 13.8 g/dL (ref 13.0–17.0)
Immature Granulocytes: 0 %
Lymphocytes Relative: 33 %
Lymphs Abs: 1.7 10*3/uL (ref 0.7–4.0)
MCH: 30.4 pg (ref 26.0–34.0)
MCHC: 34.6 g/dL (ref 30.0–36.0)
MCV: 87.9 fL (ref 80.0–100.0)
Monocytes Absolute: 0.7 10*3/uL (ref 0.1–1.0)
Monocytes Relative: 14 %
Neutro Abs: 2.7 10*3/uL (ref 1.7–7.7)
Neutrophils Relative %: 53 %
Platelets: 174 10*3/uL (ref 150–400)
RBC: 4.54 MIL/uL (ref 4.22–5.81)
RDW: 13.4 % (ref 11.5–15.5)
WBC: 5.1 10*3/uL (ref 4.0–10.5)
nRBC: 0 % (ref 0.0–0.2)

## 2019-02-11 LAB — COMPREHENSIVE METABOLIC PANEL
ALT: 32 U/L (ref 0–44)
AST: 26 U/L (ref 15–41)
Albumin: 3.7 g/dL (ref 3.5–5.0)
Alkaline Phosphatase: 68 U/L (ref 38–126)
Anion gap: 8 (ref 5–15)
BUN: 9 mg/dL (ref 8–23)
CO2: 26 mmol/L (ref 22–32)
Calcium: 9.4 mg/dL (ref 8.9–10.3)
Chloride: 106 mmol/L (ref 98–111)
Creatinine, Ser: 0.6 mg/dL — ABNORMAL LOW (ref 0.61–1.24)
GFR calc Af Amer: >60 mL/min (ref >60)
GFR calc non Af Amer: >60 mL/min (ref >60)
Glucose, Bld: 96 mg/dL (ref 70–99)
Potassium: 3.3 mmol/L — ABNORMAL LOW (ref 3.5–5.1)
Sodium: 140 mmol/L (ref 135–145)
Total Bilirubin: 0.5 mg/dL (ref 0.3–1.2)
Total Protein: 6.4 g/dL — ABNORMAL LOW (ref 6.5–8.1)

## 2019-02-11 LAB — MAGNESIUM: Magnesium: 1.9 mg/dL (ref 1.7–2.4)

## 2019-02-11 MED ORDER — AMLODIPINE BESYLATE 5 MG PO TABS
5.0000 mg | ORAL_TABLET | Freq: Every day | ORAL | Status: DC
  Administered 2019-02-11 – 2019-02-12 (×2): 5 mg via ORAL
  Filled 2019-02-11 (×2): qty 1

## 2019-02-11 MED ORDER — POTASSIUM CHLORIDE 10 MEQ/100ML IV SOLN
10.0000 meq | INTRAVENOUS | Status: AC
  Administered 2019-02-11 (×4): 10 meq via INTRAVENOUS
  Filled 2019-02-11 (×4): qty 100

## 2019-02-11 NOTE — Progress Notes (Signed)
Patient ID: Cameron West, male   DOB: 05-23-1955, 63 y.o.   MRN: [id number] Patient is resting comfortably.  The right hand bandage is clean dry and intact fortunately.  There is no breakthrough bleeding or other problems.  At present juncture he had a more restful night according to nursing staff.  I have discussed all issues with the family.  We are awaiting placement.  At present juncture he is stable from my standpoint for discharge to outpatient observation and follow-up in my office in 10 to 14 days for suture removal.  I would like to keep the bandage on until that time.  I will of course be watching him while he is in-house  Guerin Lashomb MD

## 2019-02-11 NOTE — Evaluation (Signed)
Physical Therapy Evaluation Patient Details Name: Cameron West MRN: [id number] DOB: 1955/09/29 Today's Date: 02/11/2019   History of Present Illness   63 y.o. male with medical history significant of advanced dementia, traumatic brain injury, CAD status post PCI, hypertension, hyperlipidemia presented to the ED for evaluation of hand injury. S/p irrigation debridement and digital nerve and artery repair with irrigation and debridement right hand.   Clinical Impression  Pt admitted with above. Pt presents with decreased functional mobility secondary to baseline cognitive impairments, decreased functional use of right hand, and balance deficits. Requiring min assist for limited room ambulation. Presents as high fall risk based on decreased gait speed and safety awareness. Recommending SNF to maximize functional independence and decrease caregiver burden.     Follow Up Recommendations SNF;Supervision/Assistance - 24 hour    Equipment Recommendations  None recommended by PT    Recommendations for Other Services       Precautions / Restrictions Precautions Precautions: Fall Restrictions Weight Bearing Restrictions: Yes RUE Weight Bearing: Weight bear through elbow only Other Position/Activity Restrictions: Verbal order per Amanda Pea, MD      Mobility  Bed Mobility Overal bed mobility: Needs Assistance Bed Mobility: Sit to Supine       Sit to supine: Min assist   General bed mobility comments: MinA for initiation of movement  Transfers Overall transfer level: Needs assistance Equipment used: None Transfers: Sit to/from Stand Sit to Stand: Min assist         General transfer comment: Min A to power up into standing and to gain balance.   Ambulation/Gait Ambulation/Gait assistance: Min assist Gait Distance (Feet): 15 Feet Assistive device: None Gait Pattern/deviations: Step-through pattern;Drifts right/left;Decreased stride length;Trunk flexed Gait velocity: decreased    General Gait Details: Pt unsteady, requiring minA for stability, reaching for objects for external support, slow speed.  Stairs            Wheelchair Mobility    Modified Rankin (Stroke Patients Only)       Balance Overall balance assessment: Needs assistance Sitting-balance support: No upper extremity supported;Feet supported Sitting balance-Leahy Scale: Fair     Standing balance support: No upper extremity supported;During functional activity Standing balance-Leahy Scale: Poor Standing balance comment: Reliant on Min A for standing balance                             Pertinent Vitals/Pain Pain Assessment: Faces Faces Pain Scale: No hurt    Home Living Family/patient expects to be discharged to:: Unsure                 Additional Comments: Per chart review, pt lives with his son and has been requiring increased assistance lately.     Prior Function Level of Independence: Needs assistance         Comments: Unsure of assistance for ADLs at baseline     Hand Dominance        Extremity/Trunk Assessment   Upper Extremity Assessment Upper Extremity Assessment: RUE deficits/detail RUE Deficits / Details: S/P I&D, stiff finger flexion RUE Coordination: decreased fine motor    Lower Extremity Assessment Lower Extremity Assessment: Overall WFL for tasks assessed       Communication   Communication: No difficulties  Cognition Arousal/Alertness: Awake/alert Behavior During Therapy: Flat affect Overall Cognitive Status: History of cognitive impairments - at baseline  General Comments: Baseline TBI and dementia. Pt following simple commands with increased time, unable to state name but when asked if his name was Cameron West, he responds, "yes." Asking to urinate despite having condom catheter on; decreased safety awareness and impulsive.      General Comments      Exercises     Assessment/Plan     PT Assessment Patient needs continued PT services  PT Problem List Decreased strength;Decreased range of motion;Decreased balance;Decreased mobility;Decreased cognition;Decreased safety awareness       PT Treatment Interventions Gait training;Functional mobility training;Therapeutic activities;Therapeutic exercise;Balance training;Patient/family education    PT Goals (Current goals can be found in the Care Plan section)  Acute Rehab PT Goals Patient Stated Goal: Not stated PT Goal Formulation: Patient unable to participate in goal setting Time For Goal Achievement: 02/25/19 Potential to Achieve Goals: Fair    Frequency Min 2X/week   Barriers to discharge        Co-evaluation               AM-PAC PT "6 Clicks" Mobility  Outcome Measure Help needed turning from your back to your side while in a flat bed without using bedrails?: None Help needed moving from lying on your back to sitting on the side of a flat bed without using bedrails?: A Little Help needed moving to and from a bed to a chair (including a wheelchair)?: A Little Help needed standing up from a chair using your arms (e.g., wheelchair or bedside chair)?: A Little Help needed to walk in hospital room?: A Little Help needed climbing 3-5 steps with a railing? : A Lot 6 Click Score: 18    End of Session Equipment Utilized During Treatment: Gait belt Activity Tolerance: Patient tolerated treatment well Patient left: in bed;with call bell/phone within reach;with bed alarm set Nurse Communication: Mobility status PT Visit Diagnosis: Unsteadiness on feet (R26.81)    Time: 1275-1700 PT Time Calculation (min) (ACUTE ONLY): 11 min   Charges:   PT Evaluation $PT Eval Moderate Complexity: 1 Mod          Ellamae Sia, PT, DPT [place] Pager [phone] Office [phone]   Willy Eddy 02/11/2019, 9:12 AM

## 2019-02-11 NOTE — Progress Notes (Signed)
  Speech Language Pathology Treatment: Dysphagia  Patient Details Name: Cameron West MRN: [id number] DOB: November 23, 1955 Today's Date: 02/11/2019 Time: 0920-0950 SLP Time Calculation (min) (ACUTE ONLY): 30 min  Assessment / Plan / Recommendation Clinical Impression  Pt seen at bedside for assessment of po readiness. Per RN, pt more alert today, Ox self only. Upon arrival of SLP, pt was noted to be resting. No family present at this time. Pt easily arousable from sleep, able to answer simple questions. Pt oriented to name and DOB, city. Disoriented to time and specific location.  Suction was set up at bedside to facilitate effective oral care, given cognitive impairment, right arm bandaged, and left hand in mitt. Pt accepted oral care with toothbrush and toothpaste. Following oral care, pt accepted trials of ice chips, thin liquid, puree, and solid textures. Extended oral prep noted across consistencies. Intermittent slight throat clearing noted. Recommend beginning Dys 2 (finely chopped) diet with thin liquids, crushed meds, 1:1 assistance with all PO intake, feed only when fully awake and alert, and seated upright. Safe swallow precautions posted at [facility] and communication board updated. RN and MD informed of results and recommendations. SLP will follow to assess diet tolerance and continue education.  24 hour supervision is recommended at DC.    HPI HPI: Cameron West is a 62 y.o. male with medical history significant of advanced dementia, traumatic brain injury, CAD status post PCI, hypertension, hyperlipidemia presented to the ED for evaluation of hand injury.  Family reported that patient fell while holding a drinking glass and lacerated his hand.  Now s/p surgical repair. CXR 11/4: lungs clear.      SLP Plan  Continue with current plan of care       Recommendations  Diet recommendations: Dysphagia 2 (fine chop);Thin liquid Liquids provided via: Cup;Straw Medication Administration: Crushed with  puree Supervision: Full supervision/cueing for compensatory strategies;Trained caregiver to feed patient Compensations: Minimize environmental distractions;Slow rate;Small sips/bites Postural Changes and/or Swallow Maneuvers: Seated upright 90 degrees;Upright 30-60 min after meal                Oral Care Recommendations: Oral care QID Follow up Recommendations: 24 hour supervision/assistance SLP Visit Diagnosis: Dysphagia, unspecified (R13.10) Plan: Continue with current plan of care       [place], [facility], [place] Pathologist Office: [phone] Pager: [phone]  Shonna Chock 02/11/2019, 9:52 AM

## 2019-02-11 NOTE — Consult Note (Addendum)
Consultation Note Date: 02/11/2019   Patient Name: Cameron West  DOB: 1956-03-17  MRN: [id number]  Age / Sex: 63 y.o., male  PCP: Lizbeth Bark, FNP Referring Physician: Dominica Severin, MD  Reason for Consultation: Establishing goals of care and Psychosocial/spiritual support  HPI/Patient Profile: 63 y.o. male  admitted on 02/09/2019 with past medical history significant of advanced dementia, traumatic brain injury/son verbalizes that he does not believe that this was a significant and injury as it is being addressed as, CAD status post PCI, hypertension, hyperlipidemia presented with hand injury.    Hand surgery was consulted and he underwent irrigation and debridement, radial digital nerve repair and artery repair and complex wound closure of the first webspace of the right hand on 02/09/2019.  Injury was secondary to a fall with a glass in his hand.  Patient lives at home with his son/Cameron West. and another 33 year old son.  There is a third son who lives out of state.  It is becoming increasingly difficult to provide safe care for this patient in the home and family has been investigating the possibility of long-term placement or memory care.  Cameron West's note neurology visit from January 15, 2019 reflecting family's concern for the patient's worsening condition involving hallucination and paranoia, and aggressive behavior.   Family face treatment option decisions, advanced directive decisions and anticipatory care needs.  Clinical Assessment and Goals of Care:   This NP Cameron West reviewed medical records, received report from team, assessed the patient and then meet at the patient's bedside and spoke by telephone with his son/ Cameron West to discuss diagnosis, prognosis, GOC, EOL wishes disposition and options.  Concept of  Palliative Care were discussed  A detailed discussion was had today  regarding advanced directives.  Concepts specific to code status, artifical feeding and hydration, continued IV antibiotics and rehospitalization was had.  The difference between a aggressive medical intervention path  and a palliative comfort care path for this patient at this time was had.  Values and goals of care important to patient and family were attempted to be elicited.   Questions and concerns addressed.   Family encouraged to call with questions or concerns.    PMT will continue to support holistically.    No documented healthcare power of attorney.  There are 3 living sons.  One is under age at 25 years old.  Family turn to oldest son Cameron West, [place]. who this patient lives with, here in [place]    SUMMARY OF RECOMMENDATIONS    Code Status/Advance Care Planning:   DNRdocumented today    Palliative Prophylaxis:   Bowel Regimen, Delirium Protocol and Frequent Pain Assessment  Additional Recommendations (Limitations, Scope, Preferences):  Avoid Hospitalization, Minimize Medications and No Artificial Feeding  Psycho-social/Spiritual:   Desire for further Chaplaincy support:no  Additional Recommendations: Created space and opportunity for son to explore his thoughts and feelings regarding his father's current medical situation.  It has been very difficult for the family trying to provide safe care for  this man who continues to decline physically and functionally and cognitively.  Their main hope is for safe placement and quality care.   Discussed with son the importance of continued conversation with his family and the medical providers regarding overall plan of care and treatment options,  ensuring decisions are within the context of the patients values and GOCs.   Prognosis:   Unable to determine  Discharge Planning: Discussed with transition care team raising awareness to family's desire for placement when medically stable for discharge from the hospital   Recommend outpatient palliative services at discharge.    To Be Determined      Primary Diagnoses: Present on Admission: . Laceration of right hand . Hyperlipidemia . Laceration of arm   I have reviewed the medical record, interviewed the patient and family, and examined the patient. The following aspects are pertinent.  Past Medical History:  Diagnosis Date  . CAD (coronary artery disease)    a. 11/2016: NSTEMI: 70% mid-dist RCA stenosis with vasospasm resolving with intracoronary NTG and 95% ost D1 s/p DES  . Hypertension   . Tobacco abuse   . Traumatic brain injury [facility](HCC)    a. per son report   Social History   Socioeconomic History  . Marital status: Single    Spouse name: Not on file  . Number of children: Not on file  . Years of education: Not on file  . Highest education level: Not on file  Occupational History  . Not on file  Social Needs  . Financial resource strain: Not on file  . Food insecurity    Worry: Not on file    Inability: Not on file  . Transportation needs    Medical: Not on file    Non-medical: Not on file  Tobacco Use  . Smoking status: Former Games developermoker  . Smokeless tobacco: Never Used  Substance and Sexual Activity  . Alcohol use: No  . Drug use: No  . Sexual activity: Not on file  Lifestyle  . Physical activity    Days per week: Not on file    Minutes per session: Not on file  . Stress: Not on file  Relationships  . Social Musicianconnections    Talks on phone: Not on file    Gets together: Not on file    Attends religious service: Not on file    Active member of club or organization: Not on file    Attends meetings of clubs or organizations: Not on file    Relationship status: Not on file  Other Topics Concern  . Not on file  Social History Narrative  . Not on file   Family History  Problem Relation Age of Onset  . Hypertension Mother    Scheduled Meds: . amLODipine  5 mg Oral Daily  . atorvastatin  40 mg Oral Daily  . memantine   10 mg Oral BID  . vitamin C  1,000 mg Oral Daily   Continuous Infusions: .  ceFAZolin (ANCEF) IV 1 g (02/11/19 0547)  . lactated ringers 50 mL/hr at 02/11/19 0909  . potassium chloride 10 mEq (02/11/19 1118)   PRN Meds:.acetaminophen, famotidine, haloperidol lactate, HYDROcodone-acetaminophen, HYDROcodone-acetaminophen, LORazepam, morphine injection, ondansetron **OR** ondansetron (ZOFRAN) IV Medications Prior to Admission:  Prior to Admission medications   Medication Sig Start Date End Date Taking? Authorizing Provider  aspirin 81 MG chewable tablet Chew 1 tablet (81 mg total) by mouth daily. 11/26/16  Yes Janetta Horahompson, Kathryn R, PA-C  atorvastatin (LIPITOR) 40 MG  tablet Take 1 tablet (40 mg total) by mouth daily. 01/09/18 02/09/19 Yes Nahser, Wonda Cheng, MD  Melatonin 5 MG TABS Take 5 mg by mouth at bedtime as needed (sleep).   Yes [provider]  memantine (NAMENDA) 10 MG tablet Take 1 tablet (10 mg total) by mouth 2 (two) times daily. 05/27/18  Yes Star Age, MD   No Known Allergies Review of Systems  Unable to perform ROS: Dementia    Physical Exam Constitutional:      General: He is awake.     Appearance: He is cachectic. He is ill-appearing.  Cardiovascular:     Rate and Rhythm: Normal rate and regular rhythm.  Pulmonary:     Effort: Pulmonary effort is normal.  Skin:    General: Skin is warm and dry.  Psychiatric:        Attention and Perception: He is inattentive.        Speech: He is noncommunicative.        Cognition and Memory: Cognition is impaired.     Vital Signs: BP (!) 144/84 (BP Location: Left Arm)   Pulse 63   Temp 97.7 F (36.5 C) (Oral)   Resp 18   Wt 86 kg   SpO2 100%   BMI 25.71 kg/m  Pain Scale: 0-10   Pain Score: Asleep   SpO2: SpO2: 100 % O2 Device:SpO2: 100 % O2 Flow Rate: .O2 Flow Rate (L/min): 8 L/min  IO: Intake/output summary:   Intake/Output Summary (Last 24 hours) at 02/11/2019 1134 Last data filed at 02/11/2019 0531  Gross per 24 hour  Intake 904.03 ml  Output 1001 ml  Net -96.97 ml    LBM: Last BM Date: (UTA) Baseline Weight: Weight: 86.2 kg Most recent weight: Weight: 86 kg     Palliative Assessment/Data:   Discussed with Dr Starla Link  Time In: 1000 Time Out: 1115 Time Total: 70 minutes Greater than 50%  of this time was spent counseling and coordinating care related to the above assessment and plan.  Signed by: Wadie Lessen, NP   Please contact Palliative Medicine Team phone at [phone] for questions and concerns.  For individual provider: See Shea Evans

## 2019-02-11 NOTE — Anesthesia Postprocedure Evaluation (Signed)
Anesthesia Post Note  Patient: Cameron West  Procedure(s) Performed: IRRIGATION AND DEBRIDEMENT AND NERVE AND ARTERY REPAIR  RIGHT HAND (Right Hand)     Patient location during evaluation: PACU Anesthesia Type: General Level of consciousness: awake and alert Pain management: pain level controlled Vital Signs Assessment: post-procedure vital signs reviewed and stable Respiratory status: spontaneous breathing, nonlabored ventilation, respiratory function stable and patient connected to nasal cannula oxygen Cardiovascular status: blood pressure returned to baseline and stable Postop Assessment: no apparent nausea or vomiting Anesthetic complications: no    Last Vitals:  Vitals:   02/10/19 2214 02/11/19 0558  BP: (!) 154/100 (!) 144/84  Pulse: 73 63  Resp: 16 18  Temp: 37.1 C 36.5 C  SpO2: 100% 100%    Last Pain:  Vitals:   02/11/19 0558  TempSrc: Oral  PainSc:                  Acme S

## 2019-02-11 NOTE — Progress Notes (Signed)
Patient ID: Cameron West, male   DOB: 07-Dec-1955, 63 y.o.   MRN: [id number]  PROGRESS NOTE    Cameron West  WOE:[id number] DOB: 12/27/1955 DOA: 02/09/2019 PCP: Lizbeth Bark, FNP   Brief Narrative:  63 year old male with history of advanced dementia, traumatic brain injury, CAD status post PCI, hypertension, hyperlipidemia presented with hand injury.  Hand surgery was consulted and he underwent irrigation and debridement, radial digital nerve repair and artery repair and complex wound closure of the first webspace of the right hand on 02/09/2019.  Assessment & Plan:   Laceration of right hand -Status post  irrigation and debridement, radial digital nerve repair and artery repair and complex wound closure of the first webspace of the right hand on 02/09/2019. -Hand surgery following.  Wound care and antibiotics as per surgeon  Hypertensive urgency -Blood pressure improving but still on the high side.  Monitor.  Will start oral medications as she is more awake.  Advanced dementia Agitation -We will use Haldol/Ativan as needed for agitation.  Palliative care evaluation for goals of care discussion is pending.  Continue memantine. -PT/OT eval  -Might need placement -We will request psychiatry consultation.  Hypokalemia -Replace.  Acute urinary retention -Continue in and out catheterization for now.  May need Foley if continues to have urinary retention  Hyperlipidemia -Continue statin.    DVT prophylaxis: SCDs Code Status: Full Family Communication: None at bedside Disposition Plan: Might need placement  Consultants: Hand surgery  Procedures:   irrigation and debridement, radial digital nerve repair and artery repair and complex wound closure of the first webspace of the right hand on 02/09/2019.  Antimicrobials:  Anti-infectives (From admission, onward)   Start     Dose/Rate Route Frequency Ordered Stop   02/10/19 0600  ceFAZolin (ANCEF) IVPB 2g/100 mL premix  Status:   Discontinued     2 g 200 mL/hr over 30 Minutes Intravenous On call to O.R. 02/09/19 1955 02/10/19 0028   02/10/19 0600  ceFAZolin (ANCEF) IVPB 1 g/50 mL premix     1 g 100 mL/hr over 30 Minutes Intravenous Every 8 hours 02/10/19 0144     02/10/19 0030  ceFAZolin (ANCEF) IVPB 1 g/50 mL premix     1 g 100 mL/hr over 30 Minutes Intravenous NOW 02/10/19 0027 02/10/19 0243       Subjective: Patient seen and examined at bedside.  He is more awake this morning, very poor historian.  No overnight fever or vomiting reported.  He had required Haldol last evening as per nursing staff. Objective: Vitals:   02/10/19 0808 02/10/19 1140 02/10/19 2214 02/11/19 0558  BP: (!) 152/98 (!) 155/96 (!) 154/100 (!) 144/84  Pulse: 80 78 73 63  Resp: 19 18 16 18   Temp:  98.3 F (36.8 C) 98.7 F (37.1 C) 97.7 F (36.5 C)  TempSrc:  Skin Oral Oral  SpO2: 99% 100% 100% 100%  Weight:   86 kg     Intake/Output Summary (Last 24 hours) at 02/11/2019 0727 Last data filed at 02/11/2019 0531 Gross per 24 hour  Intake 904.03 ml  Output 1601 ml  Net -696.97 ml   Filed Weights   02/09/19 1123 02/10/19 2214  Weight: 86.2 kg 86 kg    Examination:  General exam: No acute distress.  No signs of agitation.  More awake this morning, very poor historian, answers very few questions but is confused  respiratory system: Bilateral decreased breath sounds at bases, no wheezing Cardiovascular system: Rate controlled, S1-S2  heard Gastrointestinal system: Abdomen is nondistended, soft and nontender. Normal bowel sounds heard. Extremities: No cyanosis, edema.  Right hand dressing present  Data Reviewed: I have personally reviewed following labs and imaging studies  CBC: Recent Labs  Lab 02/09/19 1320 02/11/19 0243  WBC 8.1 5.1  NEUTROABS 5.6 2.7  HGB 14.2 13.8  HCT 43.8 39.9  MCV 93.6 87.9  PLT 193 884   Basic Metabolic Panel: Recent Labs  Lab 02/09/19 1320 02/11/19 0243  NA 141 140  K 3.6 3.3*  CL 110  106  CO2 23 26  GLUCOSE 91 96  BUN 24* 9  CREATININE 0.61 0.60*  CALCIUM 9.3 9.4  MG  --  1.9   GFR: CrCl cannot be calculated (Unknown ideal weight.). Liver Function Tests: Recent Labs  Lab 02/11/19 0243  AST 26  ALT 32  ALKPHOS 68  BILITOT 0.5  PROT 6.4*  ALBUMIN 3.7   No results for input(s): LIPASE, AMYLASE in the last 168 hours. No results for input(s): AMMONIA in the last 168 hours. Coagulation Profile: No results for input(s): INR, PROTIME in the last 168 hours. Cardiac Enzymes: No results for input(s): CKTOTAL, CKMB, CKMBINDEX, TROPONINI in the last 168 hours. BNP (last 3 results) No results for input(s): PROBNP in the last 8760 hours. HbA1C: No results for input(s): HGBA1C in the last 72 hours. CBG: Recent Labs  Lab 02/10/19 1154  GLUCAP 93   Lipid Profile: No results for input(s): CHOL, HDL, LDLCALC, TRIG, CHOLHDL, LDLDIRECT in the last 72 hours. Thyroid Function Tests: No results for input(s): TSH, T4TOTAL, FREET4, T3FREE, THYROIDAB in the last 72 hours. Anemia Panel: No results for input(s): VITAMINB12, FOLATE, FERRITIN, TIBC, IRON, RETICCTPCT in the last 72 hours. Sepsis Labs: No results for input(s): PROCALCITON, LATICACIDVEN in the last 168 hours.  Recent Results (from the past 240 hour(s))  SARS Coronavirus 2 by RT PCR (hospital order, performed in [facility] hospital lab) Nasopharyngeal Nasopharyngeal Swab     Status: None   Collection Time: 02/09/19  3:31 PM   Specimen: Nasopharyngeal Swab  Result Value Ref Range Status   SARS Coronavirus 2 NEGATIVE NEGATIVE Final    Comment: (NOTE) If result is NEGATIVE SARS-CoV-2 target nucleic acids are NOT DETECTED. The SARS-CoV-2 RNA is generally detectable in upper and lower  respiratory specimens during the acute phase of infection. The lowest  concentration of SARS-CoV-2 viral copies this assay can detect is 250  copies / mL. A negative result does not preclude SARS-CoV-2 infection  and should not  be used as the sole basis for treatment or other  patient management decisions.  A negative result may occur with  improper specimen collection / handling, submission of specimen other  than nasopharyngeal swab, presence of viral mutation(s) within the  areas targeted by this assay, and inadequate number of viral copies  (<250 copies / mL). A negative result must be combined with clinical  observations, patient history, and epidemiological information. If result is POSITIVE SARS-CoV-2 target nucleic acids are DETECTED. The SARS-CoV-2 RNA is generally detectable in upper and lower  respiratory specimens dur ing the acute phase of infection.  Positive  results are indicative of active infection with SARS-CoV-2.  Clinical  correlation with patient history and other diagnostic information is  necessary to determine patient infection status.  Positive results do  not rule out bacterial infection or co-infection with other viruses. If result is PRESUMPTIVE POSTIVE SARS-CoV-2 nucleic acids MAY BE PRESENT.   A presumptive positive result was  obtained on the submitted specimen  and confirmed on repeat testing.  While 2019 novel coronavirus  (SARS-CoV-2) nucleic acids may be present in the submitted sample  additional confirmatory testing may be necessary for epidemiological  and / or clinical management purposes  to differentiate between  SARS-CoV-2 and other Sarbecovirus currently known to infect humans.  If clinically indicated additional testing with an alternate test  methodology [phone] is advised. The SARS-CoV-2 RNA is generally  detectable in upper and lower respiratory sp ecimens during the acute  phase of infection. The expected result is Negative. Fact Sheet for Patients:  BoilerBrush.com.cyhttps://www.fda.gov/media/136312/download Fact Sheet for Healthcare Providers: https://pope.com/https://www.fda.gov/media/136313/download This test is not yet approved or cleared by the [place] FDA and has been authorized  for detection and/or diagnosis of SARS-CoV-2 by FDA under an Emergency Use Authorization (EUA).  This EUA will remain in effect (meaning this test can be used) for the duration of the COVID-19 declaration under Section 564(b)(1) of the Act, 21 U.S.C. section 360bbb-3(b)(1), unless the authorization is terminated or revoked sooner. Performed at [facility], [address] [place], [place] [address]          Radiology Studies: Dg Chest Port 1 View  Result Date: 02/10/2019 CLINICAL DATA:  Hypertension EXAM: PORTABLE CHEST 1 VIEW COMPARISON:  11/23/2016 FINDINGS: Cardiomegaly. Both lungs are clear. The visualized skeletal structures are unremarkable. IMPRESSION: Cardiomegaly without acute abnormality of the lungs in AP portable projection. Electronically Signed   By: Lauralyn PrimesAlex  Bibbey M.D.   On: 02/10/2019 10:13   Dg Hand Complete Right  Result Date: 02/09/2019 CLINICAL DATA:  Laceration to palmar aspect of hand with glass EXAM: RIGHT HAND - COMPLETE 3+ VIEW COMPARISON:  None. FINDINGS: Frontal, oblique, and lateral views obtained. There is a bandage in the palmar aspect of the hand. No other radiopaque foreign body. No acute fracture or dislocation. There is a small exostosis arising from the lateral aspect of the mid scaphoid bone. A cystic areas noted in the proximal scaphoid. A second cystic areas noted in the lateral aspect of the capitate bone. There is osteoarthritic change in all PIP and DIP joints as well as in the first IP joint and third MCP joint. No erosive change. IMPRESSION: Bandage in palmar aspect of hand.  No other radiopaque foreign body. No fracture or dislocation. Osteoarthritic change at multiple levels. Electronically Signed   By: Bretta BangWilliam  Woodruff III M.D.   On: 02/09/2019 12:27        Scheduled Meds: . atorvastatin  40 mg Oral Daily  . memantine  10 mg Oral BID  . vitamin C  1,000 mg Oral Daily   Continuous Infusions: .  ceFAZolin (ANCEF) IV 1 g  (02/11/19 0547)  . lactated ringers 50 mL/hr at 02/11/19 0400          Glade LloydKshitiz Alesia Oshields, MD [facility] 02/11/2019, 7:27 AM

## 2019-02-12 DIAGNOSIS — F0391 Unspecified dementia with behavioral disturbance: Secondary | ICD-10-CM

## 2019-02-12 DIAGNOSIS — R451 Restlessness and agitation: Secondary | ICD-10-CM

## 2019-02-12 MED ORDER — DIVALPROEX SODIUM 125 MG PO CSDR
125.0000 mg | DELAYED_RELEASE_CAPSULE | Freq: Two times a day (BID) | ORAL | Status: DC
  Administered 2019-02-12 – 2019-02-26 (×28): 125 mg via ORAL
  Filled 2019-02-12 (×28): qty 1

## 2019-02-12 MED ORDER — QUETIAPINE FUMARATE 25 MG PO TABS
12.5000 mg | ORAL_TABLET | Freq: Every day | ORAL | Status: DC
  Administered 2019-02-13 – 2019-02-26 (×14): 12.5 mg via ORAL
  Filled 2019-02-12 (×14): qty 1

## 2019-02-12 MED ORDER — QUETIAPINE FUMARATE 25 MG PO TABS: 25.0000 mg | ORAL_TABLET | Freq: Two times a day (BID) | ORAL | Status: DC | PRN

## 2019-02-12 MED ORDER — QUETIAPINE FUMARATE 25 MG PO TABS
100.0000 mg | ORAL_TABLET | Freq: Every day | ORAL | Status: DC
  Administered 2019-02-12 – 2019-02-25 (×14): 100 mg via ORAL
  Filled 2019-02-12 (×14): qty 4

## 2019-02-12 MED ORDER — QUETIAPINE FUMARATE 25 MG PO TABS
12.5000 mg | ORAL_TABLET | Freq: Every day | ORAL | Status: DC
  Administered 2019-02-13 – 2019-02-26 (×14): 12.5 mg via ORAL
  Filled 2019-02-12 (×13): qty 1

## 2019-02-12 NOTE — Progress Notes (Signed)
On Call MD, Jeannette Corpus, made aware of Pt persistently removing surgical dressing and multiple attempts to get out of bed. Pt sitter left @ 3pm and there is no sitter available to sit with patient. PRN haldol and ativan not working. See new orders for soft wrist restraint. Will continue to monitor.

## 2019-02-12 NOTE — Consult Note (Addendum)
[facility] Face-to-Face Psychiatry Consult   Reason for Consult:  Agitation  Referring Physician:  Dr Hanley Ben Patient Identification: Cameron West MRN:  [id number] Principal Diagnosis: Laceration of right hand Diagnosis:  Principal Problem:   Laceration of right hand Active Problems:   Hyperlipidemia   Hypertensive urgency   Acute urinary retention   Dementia with behavioral disturbance (HCC)   Laceration of arm   Palliative care by specialist   DNR (do not resuscitate)   Total Time spent with patient: 1.5 hours  Subjective:   Cameron West is a 63 y.o. male patient admitted for hand surgery.  Patient seen and evaluated in person.  He was asleep on assessment.  Data obtained from his sitter.  She reports when he awakens he attempts to get out of bed with no success and falls back to his mattress in exhaustion, returns to sleep.  History of dementia and TBI, according to the notes, he needs SNF as the family is having difficulties caring for him at home.  HPI per MD:   Cameron West is a 63 y.o. male with medical history significant of advanced dementia, traumatic brain injury, CAD status post PCI, hypertension, hyperlipidemia presented to the ED for evaluation of hand injury.  Family reported that patient fell while holding a drinking glass and lacerated his hand.  X-ray showing no foreign body.  Hand surgery was consulted.  Patient was taken to the OR and discovered to have radial digital nerve and artery injury to the index finger as well as significant thenar and abductor muscle injury.  He underwent irrigation and debridement, radial digital nerve and artery repair, and complex wound closure of the first webspace of the right hand.  Dr. Amanda Pea requested admission for observation under hospitalist service as patient noted to be hypertensive and bladder scan revealed >600 cc urine for which in and out cath was ordered.  Recommended continuing antibiotic for 24 hours.  Patient is currently very somnolent  and no history could be obtained from him.  Past Psychiatric History: dementia  Risk to Self:  none Risk to Others:  none Prior Inpatient Therapy:  none Prior Outpatient Therapy:  none  Past Medical History:  Past Medical History:  Diagnosis Date  . CAD (coronary artery disease)    a. 11/2016: NSTEMI: 70% mid-dist RCA stenosis with vasospasm resolving with intracoronary NTG and 95% ost D1 s/p DES  . Hypertension   . Tobacco abuse   . Traumatic brain injury [facility])    a. per son report    Past Surgical History:  Procedure Laterality Date  . CORONARY STENT INTERVENTION N/A 11/25/2016   Procedure: CORONARY STENT INTERVENTION;  Surgeon: Runell Gess, MD;  Location: [facility] INVASIVE CV LAB;  Service: Cardiovascular;  Laterality: N/A;  . I&D EXTREMITY Right 02/09/2019   Procedure: IRRIGATION AND DEBRIDEMENT AND NERVE AND ARTERY REPAIR  RIGHT HAND;  Surgeon: Dominica Severin, MD;  Location: [facility] OR;  Service: Orthopedics;  Laterality: Right;  . LEFT HEART CATH AND CORONARY ANGIOGRAPHY N/A 11/25/2016   Procedure: LEFT HEART CATH AND CORONARY ANGIOGRAPHY;  Surgeon: Runell Gess, MD;  Location: [facility] INVASIVE CV LAB;  Service: Cardiovascular;  Laterality: N/A;   Family History:  Family History  Problem Relation Age of Onset  . Hypertension Mother    Family Psychiatric  History: none Social History:  Social History   Substance and Sexual Activity  Alcohol Use No     Social History   Substance and Sexual Activity  Drug Use No    Social History   Socioeconomic History  . Marital status: Single    Spouse name: Not on file  . Number of children: Not on file  . Years of education: Not on file  . Highest education level: Not on file  Occupational History  . Not on file  Social Needs  . Financial resource strain: Not on file  . Food insecurity    Worry: Not on file    Inability: Not on file  . Transportation needs    Medical: Not on file    Non-medical: Not on file  Tobacco Use   . Smoking status: Former Games developermoker  . Smokeless tobacco: Never Used  Substance and Sexual Activity  . Alcohol use: No  . Drug use: No  . Sexual activity: Not on file  Lifestyle  . Physical activity    Days per week: Not on file    Minutes per session: Not on file  . Stress: Not on file  Relationships  . Social Musicianconnections    Talks on phone: Not on file    Gets together: Not on file    Attends religious service: Not on file    Active member of club or organization: Not on file    Attends meetings of clubs or organizations: Not on file    Relationship status: Not on file  Other Topics Concern  . Not on file  Social History Narrative  . Not on file   Additional Social History:    Allergies:  No Known Allergies  Labs:  Results for orders placed or performed during the hospital encounter of 02/09/19 (from the past 48 hour(s))  CBC with Differential/Platelet     Status: None   Collection Time: 02/11/19  2:43 AM  Result Value Ref Range   WBC 5.1 4.0 - 10.5 K/uL   RBC 4.54 4.22 - 5.81 MIL/uL   Hemoglobin 13.8 13.0 - 17.0 g/dL   HCT 29.539.9 62.139.0 - 30.852.0 %   MCV 87.9 80.0 - 100.0 fL   MCH 30.4 26.0 - 34.0 pg   MCHC 34.6 30.0 - 36.0 g/dL   RDW 65.713.4 84.611.5 - 96.215.5 %   Platelets 174 150 - 400 K/uL   nRBC 0.0 0.0 - 0.2 %   Neutrophils Relative % 53 %   Neutro Abs 2.7 1.7 - 7.7 K/uL   Lymphocytes Relative 33 %   Lymphs Abs 1.7 0.7 - 4.0 K/uL   Monocytes Relative 14 %   Monocytes Absolute 0.7 0.1 - 1.0 K/uL   Eosinophils Relative 0 %   Eosinophils Absolute 0.0 0.0 - 0.5 K/uL   Basophils Relative 0 %   Basophils Absolute 0.0 0.0 - 0.1 K/uL   Immature Granulocytes 0 %   Abs Immature Granulocytes 0.02 0.00 - 0.07 K/uL    Comment: Performed at [facility], [address] [place], [place] [address]  Comprehensive metabolic panel     Status: Abnormal   Collection Time: 02/11/19  2:43 AM  Result Value Ref Range   Sodium 140 135 - 145 mmol/L   Potassium 3.3 (L) 3.5 - 5.1 mmol/L    Chloride 106 98 - 111 mmol/L   CO2 26 22 - 32 mmol/L   Glucose, Bld 96 70 - 99 mg/dL   BUN 9 8 - 23 mg/dL   Creatinine, Ser 1.320.60 (L) 0.61 - 1.24 mg/dL   Calcium 9.4 8.9 - 44.010.3 mg/dL   Total Protein 6.4 (L) 6.5 - 8.1  g/dL   Albumin 3.7 3.5 - 5.0 g/dL   AST 26 15 - 41 U/L   ALT 32 0 - 44 U/L   Alkaline Phosphatase 68 38 - 126 U/L   Total Bilirubin 0.5 0.3 - 1.2 mg/dL   GFR calc non Af Amer >60 >60 mL/min   GFR calc Af Amer >60 >60 mL/min   Anion gap 8 5 - 15    Comment: Performed at [facility] [address], [place], [place] [address]  Magnesium     Status: None   Collection Time: 02/11/19  2:43 AM  Result Value Ref Range   Magnesium 1.9 1.7 - 2.4 mg/dL    Comment: Performed at [facility] [address] [place], [place] [address]    Current Facility-Administered Medications  Medication Dose Route Frequency Provider Last Rate Last Dose  . acetaminophen (TYLENOL) tablet 325-650 mg  325-650 mg Oral Q6H PRN Roseanne Kaufman, MD      . amLODipine (NORVASC) tablet 5 mg  5 mg Oral Daily Aline August, MD   5 mg at 02/12/19 1036  . atorvastatin (LIPITOR) tablet 40 mg  40 mg Oral Daily Shela Leff, MD   40 mg at 02/12/19 1036  . famotidine (PEPCID) tablet 20 mg  20 mg Oral BID PRN Roseanne Kaufman, MD      . haloperidol lactate (HALDOL) injection 2-5 mg  2-5 mg Intravenous Q6H PRN Aline August, MD   5 mg at 02/12/19 0438  . HYDROcodone-acetaminophen (NORCO) 7.5-325 MG per tablet 1-2 tablet  1-2 tablet Oral Q4H PRN Roseanne Kaufman, MD      . HYDROcodone-acetaminophen (NORCO/VICODIN) 5-325 MG per tablet 1-2 tablet  1-2 tablet Oral Q4H PRN Roseanne Kaufman, MD      . lactated ringers infusion   Intravenous Continuous Roseanne Kaufman, MD 50 mL/hr at 02/12/19 [phone]    . LORazepam (ATIVAN) injection 0.5 mg  0.5 mg Intravenous Q4H PRN Aline August, MD   0.5 mg at 02/12/19 4650  . memantine (NAMENDA) tablet 10 mg  10 mg Oral BID Shela Leff, MD   10 mg at 02/12/19  1036  . morphine 2 MG/ML injection 0.5-1 mg  0.5-1 mg Intravenous Q2H PRN Roseanne Kaufman, MD      . ondansetron [facility]) tablet 4 mg  4 mg Oral Q6H PRN Roseanne Kaufman, MD       Or  . ondansetron (ZOFRAN) injection 4 mg  4 mg Intravenous Q6H PRN Roseanne Kaufman, MD      . QUEtiapine (SEROQUEL) tablet 100 mg  100 mg Oral QHS Alekh, Kshitiz, MD      . QUEtiapine (SEROQUEL) tablet 25 mg  25 mg Oral BID PRN Aline August, MD      . vitamin C (ASCORBIC ACID) tablet 1,000 mg  1,000 mg Oral Daily Roseanne Kaufman, MD   1,000 mg at 02/12/19 1036    Musculoskeletal: Strength & Muscle Tone: decreased Gait & Station: did not witness Patient leans: N/A  Psychiatric Specialty Exam: Physical Exam  Nursing note and vitals reviewed. Constitutional: He appears well-developed and well-nourished.  HENT:  Head: Normocephalic.  Respiratory: Effort normal.  Psychiatric: His speech is normal and behavior is normal. Thought content normal. His mood appears anxious. His affect is blunt. Cognition and memory are impaired. He expresses impulsivity.    Review of Systems  Constitutional: Positive for malaise/fatigue.  Psychiatric/Behavioral: Positive for memory loss. The patient is nervous/anxious.   All other systems reviewed and are negative.  Blood pressure (!) 143/92, pulse 72, temperature 98.1 F (36.7 C), temperature source Oral, resp. rate 16, weight 86 kg, SpO2 100 %.Body mass index is 25.71 kg/m.  General Appearance: Casual  Eye Contact:  NA  Speech:  NA  Volume:  NA  Mood:  Anxious when awake  Affect:  Blunt  Thought Process:  NA  Orientation:  NA  Thought Content:  NA  Suicidal Thoughts:  NA  Homicidal Thoughts:  NA  Memory:  NA  Judgement:  Impaired  Insight:  Lacking  Psychomotor Activity:  Decreased  Concentration: Unable to assess, asleep  Recall:  NA  Fund of Knowledge:  NA  Language:  NA  Akathisia:  No  Handed:  Right  AIMS (if indicated):     Assets:  Resilience Social  Support  ADL's:  Impaired  Cognition:  Impaired,  Moderate  Sleep:      63 yo male with history of dementia and TBI, consult for agitation.  See recommendations below:  Treatment Plan Summary: Dementia with behavioral disturbance: -Continue Namenda 10 mg daily -Recommend Depakote 125 mg BID  -Recommend Seroquel 12.5 mg at 8 am and 2 pm and continue 100 mg at bedtime if agitation continues -Recommend Ativan 0.5 mg every six hours PRN agitation -Recommend SNF or memory care  Disposition: No evidence of imminent risk to self or others at present.    Nanine Means, NP 02/12/2019 1:34 PM   Attest to NP note

## 2019-02-12 NOTE — Progress Notes (Signed)
Patient ID: Cameron West, male   DOB: 12/21/1955, 63 y.o.   MRN: [id number] Patient is resting comfortably.  No complications regarding right arm surgery.  Once again, I would leave the bandage clean and dry and remove his stitches in 12-14 days.  Her graft we are awaiting placement.  I do feel comfortable discontinuing the IV antibiotics at this point  GramigMD

## 2019-02-12 NOTE — Care Management Important Message (Signed)
Important Message  Patient Details  Name: CLAXTON LEVITZ MRN: [id number] Date of Birth: 07-May-1955   Medicare Important Message Given:  Yes   Due to illness patient was not able to sign. Signed copy left at bedside Orbie Pyo 02/12/2019, 4:20 PM

## 2019-02-12 NOTE — [place] FL2 (Addendum)
[place] MEDICAID FL2 LEVEL OF CARE SCREENING TOOL     IDENTIFICATION  Patient Name: Cameron West Birthdate: [birth year] Sex: male Admission Date (Current Location): 02/09/2019  [facility] and [place] Number:  Herbalist and Address:  [facility], [place] [address] [place], [place] [address]      Provider Number: [id number]  Attending Physician Name and Address:  Roseanne Kaufman, MD  Relative Name and Phone Number:  Kreed, son, [phone]    Current Level of Care: Hospital Recommended Level of Care: [place] Prior Approval Number:    Date Approved/Denied:   PASRR Number: [id number] E expires 03/17/19  Discharge Plan: SNF    Current Diagnoses: Patient Active Problem List   Diagnosis Date Noted  . Palliative care by specialist   . DNR (do not resuscitate)   . Hypertensive urgency 02/10/2019  . Acute urinary retention 02/10/2019  . Dementia ([place]) 02/10/2019  . Laceration of arm 02/10/2019  . Laceration of right hand 02/09/2019  . Hyperlipidemia 12/02/2016  . CAD (coronary artery disease)   . Hypertension   . Tobacco abuse   . Traumatic brain injury ([place])   . NSTEMI (non-ST elevated myocardial infarction) ([place]) 11/23/2016    Orientation RESPIRATION BLADDER Height & Weight     Self  Normal Incontinent, External catheter Weight: 189 lb 9.5 oz (86 kg) Height:     BEHAVIORAL SYMPTOMS/MOOD NEUROLOGICAL BOWEL NUTRITION STATUS  (Confused, gets restless)   Continent Diet(Please see DC Summary)  AMBULATORY STATUS COMMUNICATION OF NEEDS Skin   Extensive Assist Verbally Surgical wounds(Closed incision on hand)                       Personal Care Assistance Level of Assistance  Bathing, Feeding, Dressing Bathing Assistance: Maximum assistance Feeding assistance: Limited assistance Dressing Assistance: Limited assistance     Functional Limitations Info  Sight, Hearing, Speech Sight Info: Adequate Hearing Info:  Adequate Speech Info: Adequate    SPECIAL CARE FACTORS FREQUENCY  PT (By licensed PT), OT (By licensed OT)     PT Frequency: 5x OT Frequency: 3x            Contractures Contractures Info: Not present    Additional Factors Info  Code Status, Allergies, Psychotropic Code Status Info: DNR Allergies Info: NKA Psychotropic Info: Namenda; Seroquel         Current Medications (02/12/2019):  This is the current hospital active medication list Current Facility-Administered Medications  Medication Dose Route Frequency Provider Last Rate Last Dose  . acetaminophen (TYLENOL) tablet 325-650 mg  325-650 mg Oral Q6H PRN Roseanne Kaufman, MD      . amLODipine (NORVASC) tablet 5 mg  5 mg Oral Daily Aline August, MD   5 mg at 02/11/19 1048  . atorvastatin (LIPITOR) tablet 40 mg  40 mg Oral Daily Shela Leff, MD   40 mg at 02/11/19 1048  . famotidine (PEPCID) tablet 20 mg  20 mg Oral BID PRN Roseanne Kaufman, MD      . haloperidol lactate (HALDOL) injection 2-5 mg  2-5 mg Intravenous Q6H PRN Aline August, MD   5 mg at 02/12/19 0438  . HYDROcodone-acetaminophen (NORCO) 7.5-325 MG per tablet 1-2 tablet  1-2 tablet Oral Q4H PRN Roseanne Kaufman, MD      . HYDROcodone-acetaminophen (NORCO/VICODIN) 5-325 MG per tablet 1-2 tablet  1-2 tablet Oral Q4H PRN Roseanne Kaufman, MD      . lactated ringers infusion   Intravenous Continuous  Dominica Severin, MD 50 mL/hr at 02/12/19 [phone]    . LORazepam (ATIVAN) injection 0.5 mg  0.5 mg Intravenous Q4H PRN Glade Lloyd, MD   0.5 mg at 02/12/19 0165  . memantine (NAMENDA) tablet 10 mg  10 mg Oral BID John Giovanni, MD   10 mg at 02/11/19 2231  . morphine 2 MG/ML injection 0.5-1 mg  0.5-1 mg Intravenous Q2H PRN Dominica Severin, MD      . ondansetron [facility]) tablet 4 mg  4 mg Oral Q6H PRN Dominica Severin, MD       Or  . ondansetron (ZOFRAN) injection 4 mg  4 mg Intravenous Q6H PRN Dominica Severin, MD      . QUEtiapine (SEROQUEL) tablet 100 mg  100  mg Oral QHS Alekh, Kshitiz, MD      . QUEtiapine (SEROQUEL) tablet 25 mg  25 mg Oral BID PRN Glade Lloyd, MD      . vitamin C (ASCORBIC ACID) tablet 1,000 mg  1,000 mg Oral Daily Dominica Severin, MD   1,000 mg at 02/11/19 1048     Discharge Medications: Please see discharge summary for a list of discharge medications.  Relevant Imaging Results:  Relevant Lab Results:   Additional Information SSN: [id number]   COVID negative on 11/3  Renne Crigler Yanet Balliet, LCSW

## 2019-02-12 NOTE — Progress Notes (Signed)
Patient ID: Cameron West, male   DOB: 04/28/1955, 63 y.o.   MRN: [id number]  PROGRESS NOTE    Cameron West  [id number] DOB: 04/28/1955 DOA: 02/09/2019 PCP: Lizbeth BarkHairston, Mandesia R, FNP   Brief Narrative:  63 year old male with history of advanced dementia, traumatic brain injury, CAD status post PCI, hypertension, hyperlipidemia presented with hand injury.  Hand surgery was consulted and he underwent irrigation and debridement, radial digital nerve repair and artery repair and complex wound closure of the first webspace of the right hand on 02/09/2019.  Assessment & Plan:   Laceration of right hand -Status post  irrigation and debridement, radial digital nerve repair and artery repair and complex wound closure of the first webspace of the right hand on 02/09/2019. -Hand surgery following.  Wound care and antibiotics as per surgeon  Hypertensive urgency -Blood pressure improving.  Continue amlodipine.  Advanced dementia Agitation -We will use Haldol/Ativan as needed for agitation.  Palliative care evaluation for goals of care discussion is appreciated.  Continue memantine. -PT recommends SNF placement.  Social worker consulted. -We will use Seroquel nightly scheduled dose and Seroquel as needed for agitation.  Psychiatry evaluation pending.  Acute urinary retention -Continue in and out catheterization for now.  May need Foley if continues to have urinary retention  Hyperlipidemia -Continue statin.  DVT prophylaxis: SCDs Code Status: DNR  family Communication: None at bedside Disposition Plan: SNF once bed is available Consultants: Hand surgery  Procedures:   irrigation and debridement, radial digital nerve repair and artery repair and complex wound closure of the first webspace of the right hand on 02/09/2019.  Antimicrobials:  Anti-infectives (From admission, onward)   Start     Dose/Rate Route Frequency Ordered Stop   02/10/19 0600  ceFAZolin (ANCEF) IVPB 2g/100 mL premix   Status:  Discontinued     2 g 200 mL/hr over 30 Minutes Intravenous On call to O.R. 02/09/19 1955 02/10/19 0028   02/10/19 0600  ceFAZolin (ANCEF) IVPB 1 g/50 mL premix  Status:  Discontinued     1 g 100 mL/hr over 30 Minutes Intravenous Every 8 hours 02/10/19 0144 02/12/19 0753   02/10/19 0030  ceFAZolin (ANCEF) IVPB 1 g/50 mL premix     1 g 100 mL/hr over 30 Minutes Intravenous NOW 02/10/19 0027 02/10/19 0243       Subjective: Patient seen and examined at bedside.  Poor historian.  Currently sleepy.  Had required Ativan and Haldol overnight and early this morning.   Objective: Vitals:   02/10/19 2214 02/11/19 0558 02/11/19 1452 02/11/19 2144  BP: (!) 154/100 (!) 144/84 104/66 108/81  Pulse: 73 63 82 69  Resp: 16 18 20 18   Temp: 98.7 F (37.1 C) 97.7 F (36.5 C) 98.4 F (36.9 C) 98.6 F (37 C)  TempSrc: Oral Oral Oral Oral  SpO2: 100% 100% 100% 99%  Weight: 86 kg       Intake/Output Summary (Last 24 hours) at 02/12/2019 0754 Last data filed at 02/12/2019 [id number] Gross per 24 hour  Intake 1529.08 ml  Output 950 ml  Net 579.08 ml   Filed Weights   02/09/19 1123 02/10/19 2214  Weight: 86.2 kg 86 kg    Examination:  General exam: No distress currently.  No signs of agitation.  Sleepy, hardly wakes up on calling his name  respiratory system: Bilateral decreased breath sounds at bases Cardiovascular system: S1-S2 heard, rate controlled Gastrointestinal system: Abdomen is nondistended, soft and nontender. Normal bowel sounds heard. Extremities: No cyanosis,  edema.  Right hand dressing present  Data Reviewed: I have personally reviewed following labs and imaging studies  CBC: Recent Labs  Lab 02/09/19 1320 02/11/19 0243  WBC 8.1 5.1  NEUTROABS 5.6 2.7  HGB 14.2 13.8  HCT 43.8 39.9  MCV 93.6 87.9  PLT 193 174   Basic Metabolic Panel: Recent Labs  Lab 02/09/19 1320 02/11/19 0243  NA 141 140  K 3.6 3.3*  CL 110 106  CO2 23 26  GLUCOSE 91 96  BUN 24* 9   CREATININE 0.61 0.60*  CALCIUM 9.3 9.4  MG  --  1.9   GFR: CrCl cannot be calculated (Unknown ideal weight.). Liver Function Tests: Recent Labs  Lab 02/11/19 0243  AST 26  ALT 32  ALKPHOS 68  BILITOT 0.5  PROT 6.4*  ALBUMIN 3.7   No results for input(s): LIPASE, AMYLASE in the last 168 hours. No results for input(s): AMMONIA in the last 168 hours. Coagulation Profile: No results for input(s): INR, PROTIME in the last 168 hours. Cardiac Enzymes: No results for input(s): CKTOTAL, CKMB, CKMBINDEX, TROPONINI in the last 168 hours. BNP (last 3 results) No results for input(s): PROBNP in the last 8760 hours. HbA1C: No results for input(s): HGBA1C in the last 72 hours. CBG: Recent Labs  Lab 02/10/19 1154  GLUCAP 93   Lipid Profile: No results for input(s): CHOL, HDL, LDLCALC, TRIG, CHOLHDL, LDLDIRECT in the last 72 hours. Thyroid Function Tests: No results for input(s): TSH, T4TOTAL, FREET4, T3FREE, THYROIDAB in the last 72 hours. Anemia Panel: No results for input(s): VITAMINB12, FOLATE, FERRITIN, TIBC, IRON, RETICCTPCT in the last 72 hours. Sepsis Labs: No results for input(s): PROCALCITON, LATICACIDVEN in the last 168 hours.  Recent Results (from the past 240 hour(s))  SARS Coronavirus 2 by RT PCR (hospital order, performed in [facility] hospital lab) Nasopharyngeal Nasopharyngeal Swab     Status: None   Collection Time: 02/09/19  3:31 PM   Specimen: Nasopharyngeal Swab  Result Value Ref Range Status   SARS Coronavirus 2 NEGATIVE NEGATIVE Final    Comment: (NOTE) If result is NEGATIVE SARS-CoV-2 target nucleic acids are NOT DETECTED. The SARS-CoV-2 RNA is generally detectable in upper and lower  respiratory specimens during the acute phase of infection. The lowest  concentration of SARS-CoV-2 viral copies this assay can detect is 250  copies / mL. A negative result does not preclude SARS-CoV-2 infection  and should not be used as the sole basis for treatment or  other  patient management decisions.  A negative result may occur with  improper specimen collection / handling, submission of specimen other  than nasopharyngeal swab, presence of viral mutation(s) within the  areas targeted by this assay, and inadequate number of viral copies  (<250 copies / mL). A negative result must be combined with clinical  observations, patient history, and epidemiological information. If result is POSITIVE SARS-CoV-2 target nucleic acids are DETECTED. The SARS-CoV-2 RNA is generally detectable in upper and lower  respiratory specimens dur ing the acute phase of infection.  Positive  results are indicative of active infection with SARS-CoV-2.  Clinical  correlation with patient history and other diagnostic information is  necessary to determine patient infection status.  Positive results do  not rule out bacterial infection or co-infection with other viruses. If result is PRESUMPTIVE POSTIVE SARS-CoV-2 nucleic acids MAY BE PRESENT.   A presumptive positive result was obtained on the submitted specimen  and confirmed on repeat testing.  While 2019 novel coronavirus  (  SARS-CoV-2) nucleic acids may be present in the submitted sample  additional confirmatory testing may be necessary for epidemiological  and / or clinical management purposes  to differentiate between  SARS-CoV-2 and other Sarbecovirus currently known to infect humans.  If clinically indicated additional testing with an alternate test  methodology [id number]) is advised. The SARS-CoV-2 RNA is generally  detectable in upper and lower respiratory sp ecimens during the acute  phase of infection. The expected result is Negative. Fact Sheet for Patients:  StrictlyIdeas.no Fact Sheet for Healthcare Providers: BankingDealers.co.za This test is not yet approved or cleared by the [place] FDA and has been authorized for detection and/or diagnosis of  SARS-CoV-2 by FDA under an Emergency Use Authorization (EUA).  This EUA will remain in effect (meaning this test can be used) for the duration of the COVID-19 declaration under Section 564(b)(1) of the Act, 21 U.S.C. section 360bbb-3(b)(1), unless the authorization is terminated or revoked sooner. Performed at [facility], [place] [address], [place], [place] [address]          Radiology Studies: Dg Chest Port 1 View  Result Date: 02/10/2019 CLINICAL DATA:  Hypertension EXAM: PORTABLE CHEST 1 VIEW COMPARISON:  11/23/2016 FINDINGS: Cardiomegaly. Both lungs are clear. The visualized skeletal structures are unremarkable. IMPRESSION: Cardiomegaly without acute abnormality of the lungs in AP portable projection. Electronically Signed   By: Eddie Candle M.D.   On: 02/10/2019 10:13        Scheduled Meds: . amLODipine  5 mg Oral Daily  . atorvastatin  40 mg Oral Daily  . memantine  10 mg Oral BID  . vitamin C  1,000 mg Oral Daily   Continuous Infusions: . lactated ringers 50 mL/hr at 02/12/19 0177          Aline August, MD [facility] 02/12/2019, 7:54 AM

## 2019-02-13 MED ORDER — OCUVITE-LUTEIN PO CAPS
1.0000 | ORAL_CAPSULE | Freq: Every day | ORAL | Status: DC
  Filled 2019-02-13: qty 1

## 2019-02-13 MED ORDER — PROSIGHT PO TABS
1.0000 | ORAL_TABLET | Freq: Every day | ORAL | Status: DC
  Administered 2019-02-14 – 2019-02-26 (×13): 1 via ORAL
  Filled 2019-02-13 (×14): qty 1

## 2019-02-13 MED ORDER — PRO-STAT SUGAR FREE PO LIQD
30.0000 mL | Freq: Two times a day (BID) | ORAL | Status: DC
  Administered 2019-02-13 – 2019-02-26 (×27): 30 mL via ORAL
  Filled 2019-02-13 (×27): qty 30

## 2019-02-13 MED ORDER — ENSURE ENLIVE PO LIQD
237.0000 mL | Freq: Two times a day (BID) | ORAL | Status: DC
  Administered 2019-02-15 – 2019-02-26 (×24): 237 mL via ORAL

## 2019-02-13 MED ORDER — AMLODIPINE BESYLATE 10 MG PO TABS
10.0000 mg | ORAL_TABLET | Freq: Every day | ORAL | Status: DC
  Administered 2019-02-13 – 2019-02-26 (×14): 10 mg via ORAL
  Filled 2019-02-13 (×14): qty 1

## 2019-02-13 NOTE — Progress Notes (Signed)
Initial Nutrition Assessment  DOCUMENTATION CODES:   Not applicable  INTERVENTION:  -Ensure Enlive po BID, each supplement provides 350 kcal and 20 grams of protein -Prostat 30 ml po BID, each supplement provides 100 kcal and 15 grams of protein -Ocuvite Lutein MVI daily, provides 200 mg vit C, 40 mg zinc, 55 mcg selenium, 2 mg copper, 2 mg lutein to support wound healing   NUTRITION DIAGNOSIS:   Increased nutrient needs related to wound healing(I/D; radial digital nerve and artery repair; complex wound closure) as evidenced by estimated needs.    GOAL:   Patient will meet greater than or equal to 90% of their needs    MONITOR:   PO intake, Labs, I & O's, Supplement acceptance, Skin, Weight trends  REASON FOR ASSESSMENT:   Malnutrition Screening Tool    ASSESSMENT:  RD working remotely.  63 year old male with past medical history significant for advanced dementia, TBI, CAD s/p PCI, HTN, HLD who presented to ED for evaluation of right hand laceration after patient fell while holding a drinking glass. Surgery consulted; patient taken to OR and discovered to have radial nerve and artery injury to the index finger as well as significant thenar and abductor muscle injury. Patient underwent irrigation and debridement, radial digital nerve and artery repair with complex wound closure of first webspace of right hand.  11/3-I/D; radial digital nerve and artery repair  Unable to obtain nutrition history at this time, pt with advanced dementia. Per chart review he is a poor historian, agitation noted by nursing staff. Patient examined by Psychiatry, noted somnolent and no history could be obtained. Patient's family noted to have difficulties caring for him and SNF recommended at discharge.  Patient with varied 0-100% meal intake (71% average of last 8 documented meals) RD will provide Ensure to aid with calorie/protien needs as well as Prostat to promote wound healing.   Weight  history appears stable over the past year per chart review.  I/Os: +524 ml since admit    +227 ml x 24 hrs UOP: 1150 ml x 24 hrs Medications reviewed and include: Depakote, Seroquel, Vitamin C Labs: K 3.3 (L)  NUTRITION - FOCUSED PHYSICAL EXAM: Unable to complete at this time, RD working remotely.    Diet Order:   Diet Order            DIET DYS 2 Room service appropriate? No; Fluid consistency: Thin  Diet effective now              EDUCATION NEEDS:   No education needs have been identified at this time  Skin:  Skin Assessment: Reviewed RN Assessment(incision;closed;right;hand)  Last BM:     Height:   Ht Readings from Last 1 Encounters:  05/27/18 6' (1.829 m)    Weight:   Wt Readings from Last 1 Encounters:  02/13/19 89 kg    Ideal Body Weight:  80.9 kg  BMI:  Body mass index is 26.61 kg/m.  Estimated Nutritional Needs:   Kcal:  2200-2400 (MSJ 1.1-1.2)  Protein:  110-120  Fluid:  >/= 2.2 L/day  Lajuan Lines, RD, LDN Clinical Nutrition [place] Telephone [phone] After Hours/Weekend Pager: [phone]

## 2019-02-13 NOTE — Progress Notes (Signed)
Patient ID: Cameron West, male   DOB: 10-02-1955, 63 y.o.   MRN: [id number]  PROGRESS NOTE    YAHIA BOTTGER  OIZ:[id number] DOB: 02/13/1956 DOA: 02/09/2019 PCP: Lizbeth Bark, FNP   Brief Narrative:  63 year old male with history of advanced dementia, traumatic brain injury, CAD status post PCI, hypertension, hyperlipidemia presented with hand injury.  Hand surgery was consulted and he underwent irrigation and debridement, radial digital nerve repair and artery repair and complex wound closure of the first webspace of the right hand on 02/09/2019.  Assessment & Plan:   Laceration of right hand -Status post  irrigation and debridement, radial digital nerve repair and artery repair and complex wound closure of the first webspace of the right hand on 02/09/2019. -Hand surgery following.  Wound care as per surgeon.  Antibiotics discontinued on 02/12/2019 by hand surgery.  Hypertensive urgency -Blood pressure still on the higher side.  Increase amlodipine to 10 mg daily.  Advanced dementia Agitation -We will use Haldol/Ativan as needed for agitation.  Palliative care evaluation for goals of care discussion is appreciated.  Continue memantine. -PT recommends SNF placement.  Social worker consulted. -Psychiatry evaluation appreciated.  Continue Seroquel and Depakote as per psychiatry recommendations.  Acute urinary retention -Continue in and out catheterization for now.  May need Foley if continues to have urinary retention  Hyperlipidemia -Continue statin.  DVT prophylaxis: SCDs Code Status: DNR  family Communication: None at bedside Disposition Plan: SNF once bed is available Consultants: Hand surgery  Procedures:   irrigation and debridement, radial digital nerve repair and artery repair and complex wound closure of the first webspace of the right hand on 02/09/2019.  Antimicrobials:  Anti-infectives (From admission, onward)   Start     Dose/Rate Route Frequency Ordered Stop   02/10/19 0600  ceFAZolin (ANCEF) IVPB 2g/100 mL premix  Status:  Discontinued     2 g 200 mL/hr over 30 Minutes Intravenous On call to O.R. 02/09/19 1955 02/10/19 0028   02/10/19 0600  ceFAZolin (ANCEF) IVPB 1 g/50 mL premix  Status:  Discontinued     1 g 100 mL/hr over 30 Minutes Intravenous Every 8 hours 02/10/19 0144 02/12/19 0753   02/10/19 0030  ceFAZolin (ANCEF) IVPB 1 g/50 mL premix     1 g 100 mL/hr over 30 Minutes Intravenous NOW 02/10/19 0027 02/10/19 0243       Subjective: Patient seen and examined at bedside.  Poor historian.  No overnight fever, vomiting or extreme agitation noted by nursing staff. Objective: Vitals:   02/12/19 1032 02/12/19 2043 02/13/19 0408 02/13/19 0600  BP: (!) 143/92 140/80  (!) 163/98  Pulse: 72 60  (!) 52  Resp: 16 16  16   Temp: 98.1 F (36.7 C) 98.2 F (36.8 C)  98.6 F (37 C)  TempSrc: Oral Oral    SpO2: 100% 100%  100%  Weight:   89 kg     Intake/Output Summary (Last 24 hours) at 02/13/2019 0741 Last data filed at 02/13/2019 0600 Gross per 24 hour  Intake 1372.71 ml  Output 1150 ml  Net 222.71 ml   Filed Weights   02/09/19 1123 02/10/19 2214 02/13/19 0408  Weight: 86.2 kg 86 kg 89 kg    Examination:  General exam: No acute distress.  No signs of agitation.  Slightly awake but confused  respiratory system: Bilateral decreased breath sounds at bases, no wheezing Cardiovascular system: Rate controlled, S1-S2 heard Gastrointestinal system: Abdomen is nondistended, soft and nontender. Normal bowel sounds  heard. Extremities: No cyanosis, edema.  Right hand dressing present  Data Reviewed: I have personally reviewed following labs and imaging studies  CBC: Recent Labs  Lab 02/09/19 1320 02/11/19 0243  WBC 8.1 5.1  NEUTROABS 5.6 2.7  HGB 14.2 13.8  HCT 43.8 39.9  MCV 93.6 87.9  PLT 193 825   Basic Metabolic Panel: Recent Labs  Lab 02/09/19 1320 02/11/19 0243  NA 141 140  K 3.6 3.3*  CL 110 106  CO2 23 26  GLUCOSE  91 96  BUN 24* 9  CREATININE 0.61 0.60*  CALCIUM 9.3 9.4  MG  --  1.9   GFR: CrCl cannot be calculated (Unknown ideal weight.). Liver Function Tests: Recent Labs  Lab 02/11/19 0243  AST 26  ALT 32  ALKPHOS 68  BILITOT 0.5  PROT 6.4*  ALBUMIN 3.7   No results for input(s): LIPASE, AMYLASE in the last 168 hours. No results for input(s): AMMONIA in the last 168 hours. Coagulation Profile: No results for input(s): INR, PROTIME in the last 168 hours. Cardiac Enzymes: No results for input(s): CKTOTAL, CKMB, CKMBINDEX, TROPONINI in the last 168 hours. BNP (last 3 results) No results for input(s): PROBNP in the last 8760 hours. HbA1C: No results for input(s): HGBA1C in the last 72 hours. CBG: Recent Labs  Lab 02/10/19 1154  GLUCAP 93   Lipid Profile: No results for input(s): CHOL, HDL, LDLCALC, TRIG, CHOLHDL, LDLDIRECT in the last 72 hours. Thyroid Function Tests: No results for input(s): TSH, T4TOTAL, FREET4, T3FREE, THYROIDAB in the last 72 hours. Anemia Panel: No results for input(s): VITAMINB12, FOLATE, FERRITIN, TIBC, IRON, RETICCTPCT in the last 72 hours. Sepsis Labs: No results for input(s): PROCALCITON, LATICACIDVEN in the last 168 hours.  Recent Results (from the past 240 hour(s))  SARS Coronavirus 2 by RT PCR (hospital order, performed in [facility] hospital lab) Nasopharyngeal Nasopharyngeal Swab     Status: None   Collection Time: 02/09/19  3:31 PM   Specimen: Nasopharyngeal Swab  Result Value Ref Range Status   SARS Coronavirus 2 NEGATIVE NEGATIVE Final    Comment: (NOTE) If result is NEGATIVE SARS-CoV-2 target nucleic acids are NOT DETECTED. The SARS-CoV-2 RNA is generally detectable in upper and lower  respiratory specimens during the acute phase of infection. The lowest  concentration of SARS-CoV-2 viral copies this assay can detect is 250  copies / mL. A negative result does not preclude SARS-CoV-2 infection  and should not be used as the sole basis  for treatment or other  patient management decisions.  A negative result may occur with  improper specimen collection / handling, submission of specimen other  than nasopharyngeal swab, presence of viral mutation(s) within the  areas targeted by this assay, and inadequate number of viral copies  (<250 copies / mL). A negative result must be combined with clinical  observations, patient history, and epidemiological information. If result is POSITIVE SARS-CoV-2 target nucleic acids are DETECTED. The SARS-CoV-2 RNA is generally detectable in upper and lower  respiratory specimens dur ing the acute phase of infection.  Positive  results are indicative of active infection with SARS-CoV-2.  Clinical  correlation with patient history and other diagnostic information is  necessary to determine patient infection status.  Positive results do  not rule out bacterial infection or co-infection with other viruses. If result is PRESUMPTIVE POSTIVE SARS-CoV-2 nucleic acids MAY BE PRESENT.   A presumptive positive result was obtained on the submitted specimen  and confirmed on repeat testing.  While 2019 novel coronavirus  (SARS-CoV-2) nucleic acids may be present in the submitted sample  additional confirmatory testing may be necessary for epidemiological  and / or clinical management purposes  to differentiate between  SARS-CoV-2 and other Sarbecovirus currently known to infect humans.  If clinically indicated additional testing with an alternate test  methodology [phone]) is advised. The SARS-CoV-2 RNA is generally  detectable in upper and lower respiratory sp ecimens during the acute  phase of infection. The expected result is Negative. Fact Sheet for Patients:  BoilerBrush.com.cyhttps://www.fda.gov/media/136312/download Fact Sheet for Healthcare Providers: https://pope.com/https://www.fda.gov/media/136313/download This test is not yet approved or cleared by the [place] FDA and has been authorized for detection and/or  diagnosis of SARS-CoV-2 by FDA under an Emergency Use Authorization (EUA).  This EUA will remain in effect (meaning this test can be used) for the duration of the COVID-19 declaration under Section 564(b)(1) of the Act, 21 U.S.C. section 360bbb-3(b)(1), unless the authorization is terminated or revoked sooner. Performed at [facility], [address] [place], [place] [address]          Radiology Studies: No results found.      Scheduled Meds: . amLODipine  5 mg Oral Daily  . atorvastatin  40 mg Oral Daily  . divalproex  125 mg Oral BID  . memantine  10 mg Oral BID  . QUEtiapine  100 mg Oral QHS  . QUEtiapine  12.5 mg Oral QAC breakfast  . QUEtiapine  12.5 mg Oral Q1400  . vitamin C  1,000 mg Oral Daily   Continuous Infusions: . lactated ringers 50 mL/hr at 02/13/19 0600          Glade LloydKshitiz Angala Hilgers, MD [facility] 02/13/2019, 7:41 AM

## 2019-02-13 NOTE — TOC Progression Note (Signed)
Transition of Care [facility]) - Progression Note    Patient Details  Name: Cameron West MRN: [id number] Date of Birth: 03-30-1956  Transition of Care [facility]) CM/SW [place], [place] Phone Number: 02/13/2019, 12:58 PM  Clinical Narrative:    PASRR pending, additional requested clinical was uploaded to [facility].   Expected Discharge Plan: Long Term Nursing Home Barriers to Discharge: Continued Medical Work up  Expected Discharge Plan and Services Expected Discharge Plan: [place]   Social Determinants of Health (SDOH) Interventions    Readmission Risk Interventions No flowsheet data found.

## 2019-02-14 NOTE — Progress Notes (Signed)
Patient was agitated and upon arrival in to room, patient had removed splint/ace wrap of the right hand. Writer did best to re-wrap and fix. Will notify next shift so that orthopedic office can be contacted Monday.

## 2019-02-14 NOTE — Progress Notes (Signed)
Patient ID: Cameron West, male   DOB: October 06, 1955, 63 y.o.   MRN: [id number]  PROGRESS NOTE    Cameron West  [id number] DOB: October 06, 1955 DOA: 02/09/2019 PCP: Lizbeth BarkHairston, Mandesia R, FNP   Brief Narrative:  63 year old male with history of advanced dementia, traumatic brain injury, CAD status post PCI, hypertension, hyperlipidemia presented with hand injury.  Hand surgery was consulted and he underwent irrigation and debridement, radial digital nerve repair and artery repair and complex wound closure of the first webspace of the right hand on 02/09/2019.  Assessment & Plan:   Laceration of right hand -Status post  irrigation and debridement, radial digital nerve repair and artery repair and complex wound closure of the first webspace of the right hand on 02/09/2019. -Hand surgery following.  Wound care as per surgeon.  Antibiotics discontinued on 02/12/2019 by hand surgery.  Hypertensive urgency -Blood pressure improving.  Continue amlodipine.  Advanced dementia Agitation -We will use Haldol/Ativan as needed for agitation.  Palliative care evaluation for goals of care discussion is appreciated.  Continue memantine. -PT recommends SNF placement.  Social worker consulted. -Psychiatry evaluation appreciated.  Continue Seroquel and Depakote as per psychiatry recommendations.  Patient has been more calm after starting scheduled doses of Seroquel and adding Depakote.  Continue the same.  Acute urinary retention -Continue in and out catheterization for now.  May need Foley if continues to have urinary retention  Hyperlipidemia -Continue statin.  DVT prophylaxis: SCDs Code Status: DNR  family Communication: None at bedside Disposition Plan: SNF once bed is available Consultants: Hand surgery  Procedures:   irrigation and debridement, radial digital nerve repair and artery repair and complex wound closure of the first webspace of the right hand on 02/09/2019.  Antimicrobials:  Anti-infectives  (From admission, onward)   Start     Dose/Rate Route Frequency Ordered Stop   02/10/19 0600  ceFAZolin (ANCEF) IVPB 2g/100 mL premix  Status:  Discontinued     2 g 200 mL/hr over 30 Minutes Intravenous On call to O.R. 02/09/19 1955 02/10/19 0028   02/10/19 0600  ceFAZolin (ANCEF) IVPB 1 g/50 mL premix  Status:  Discontinued     1 g 100 mL/hr over 30 Minutes Intravenous Every 8 hours 02/10/19 0144 02/12/19 0753   02/10/19 0030  ceFAZolin (ANCEF) IVPB 1 g/50 mL premix     1 g 100 mL/hr over 30 Minutes Intravenous NOW 02/10/19 0027 02/10/19 0243       Subjective: Patient seen and examined at bedside.  Poor historian.  No fever, vomiting reported by nursing staff.  He had to be given a dose of Haldol last night.  Apparently he had pulled out his hand dressing last evening which had to be dressed again.  Objective: Vitals:   02/13/19 1244 02/13/19 2000 02/13/19 2014 02/14/19 0525  BP: (!) 140/98  135/90 (!) 104/56  Pulse: 61  73 61  Resp:   15 17  Temp: 98.6 F (37 C)  98.7 F (37.1 C) 98 F (36.7 C)  TempSrc: Oral  Oral Oral  SpO2:   99% 100%  Weight:      Height:  6\' 2"  (1.88 m)      Intake/Output Summary (Last 24 hours) at 02/14/2019 0756 Last data filed at 02/14/2019 0556 Gross per 24 hour  Intake 447 ml  Output 1075 ml  Net -628 ml   Filed Weights   02/09/19 1123 02/10/19 2214 02/13/19 0408  Weight: 86.2 kg 86 kg 89 kg  Examination:  General exam: No distress.  No signs of agitation.  Sleepy, hardly wakes up.   Respiratory system: Bilateral decreased breath sounds at bases Cardiovascular system: S1-S2 heard, rate controlled Gastrointestinal system: Abdomen is nondistended, soft and nontender. Normal bowel sounds heard. Extremities: No cyanosis.  Right hand dressing present  Data Reviewed: I have personally reviewed following labs and imaging studies  CBC: Recent Labs  Lab 02/09/19 1320 02/11/19 0243  WBC 8.1 5.1  NEUTROABS 5.6 2.7  HGB 14.2 13.8  HCT  43.8 39.9  MCV 93.6 87.9  PLT 193 174   Basic Metabolic Panel: Recent Labs  Lab 02/09/19 1320 02/11/19 0243  NA 141 140  K 3.6 3.3*  CL 110 106  CO2 23 26  GLUCOSE 91 96  BUN 24* 9  CREATININE 0.61 0.60*  CALCIUM 9.3 9.4  MG  --  1.9   GFR: Estimated Creatinine Clearance: 112.7 mL/min (A) (by C-G formula based on SCr of 0.6 mg/dL (L)). Liver Function Tests: Recent Labs  Lab 02/11/19 0243  AST 26  ALT 32  ALKPHOS 68  BILITOT 0.5  PROT 6.4*  ALBUMIN 3.7   No results for input(s): LIPASE, AMYLASE in the last 168 hours. No results for input(s): AMMONIA in the last 168 hours. Coagulation Profile: No results for input(s): INR, PROTIME in the last 168 hours. Cardiac Enzymes: No results for input(s): CKTOTAL, CKMB, CKMBINDEX, TROPONINI in the last 168 hours. BNP (last 3 results) No results for input(s): PROBNP in the last 8760 hours. HbA1C: No results for input(s): HGBA1C in the last 72 hours. CBG: Recent Labs  Lab 02/10/19 1154  GLUCAP 93   Lipid Profile: No results for input(s): CHOL, HDL, LDLCALC, TRIG, CHOLHDL, LDLDIRECT in the last 72 hours. Thyroid Function Tests: No results for input(s): TSH, T4TOTAL, FREET4, T3FREE, THYROIDAB in the last 72 hours. Anemia Panel: No results for input(s): VITAMINB12, FOLATE, FERRITIN, TIBC, IRON, RETICCTPCT in the last 72 hours. Sepsis Labs: No results for input(s): PROCALCITON, LATICACIDVEN in the last 168 hours.  Recent Results (from the past 240 hour(s))  SARS Coronavirus 2 by RT PCR (hospital order, performed in [facility] hospital lab) Nasopharyngeal Nasopharyngeal Swab     Status: None   Collection Time: 02/09/19  3:31 PM   Specimen: Nasopharyngeal Swab  Result Value Ref Range Status   SARS Coronavirus 2 NEGATIVE NEGATIVE Final    Comment: (NOTE) If result is NEGATIVE SARS-CoV-2 target nucleic acids are NOT DETECTED. The SARS-CoV-2 RNA is generally detectable in upper and lower  respiratory specimens during the  acute phase of infection. The lowest  concentration of SARS-CoV-2 viral copies this assay can detect is 250  copies / mL. A negative result does not preclude SARS-CoV-2 infection  and should not be used as the sole basis for treatment or other  patient management decisions.  A negative result may occur with  improper specimen collection / handling, submission of specimen other  than nasopharyngeal swab, presence of viral mutation(s) within the  areas targeted by this assay, and inadequate number of viral copies  (<250 copies / mL). A negative result must be combined with clinical  observations, patient history, and epidemiological information. If result is POSITIVE SARS-CoV-2 target nucleic acids are DETECTED. The SARS-CoV-2 RNA is generally detectable in upper and lower  respiratory specimens dur ing the acute phase of infection.  Positive  results are indicative of active infection with SARS-CoV-2.  Clinical  correlation with patient history and other diagnostic information is  necessary to determine patient infection status.  Positive results do  not rule out bacterial infection or co-infection with other viruses. If result is PRESUMPTIVE POSTIVE SARS-CoV-2 nucleic acids MAY BE PRESENT.   A presumptive positive result was obtained on the submitted specimen  and confirmed on repeat testing.  While 2019 novel coronavirus  (SARS-CoV-2) nucleic acids may be present in the submitted sample  additional confirmatory testing may be necessary for epidemiological  and / or clinical management purposes  to differentiate between  SARS-CoV-2 and other Sarbecovirus currently known to infect humans.  If clinically indicated additional testing with an alternate test  methodology [phone]) is advised. The SARS-CoV-2 RNA is generally  detectable in upper and lower respiratory sp ecimens during the acute  phase of infection. The expected result is Negative. Fact Sheet for Patients:   StrictlyIdeas.no Fact Sheet for Healthcare Providers: BankingDealers.co.za This test is not yet approved or cleared by the [place] FDA and has been authorized for detection and/or diagnosis of SARS-CoV-2 by FDA under an Emergency Use Authorization (EUA).  This EUA will remain in effect (meaning this test can be used) for the duration of the COVID-19 declaration under Section 564(b)(1) of the Act, 21 U.S.C. section 360bbb-3(b)(1), unless the authorization is terminated or revoked sooner. Performed at [facility], [place] [address], [place], [place] [address]          Radiology Studies: No results found.      Scheduled Meds: . amLODipine  10 mg Oral Daily  . atorvastatin  40 mg Oral Daily  . divalproex  125 mg Oral BID  . feeding supplement (ENSURE ENLIVE)  237 mL Oral BID BM  . feeding supplement (PRO-STAT SUGAR FREE 64)  30 mL Oral BID  . memantine  10 mg Oral BID  . multivitamin  1 tablet Oral Daily  . QUEtiapine  100 mg Oral QHS  . QUEtiapine  12.5 mg Oral QAC breakfast  . QUEtiapine  12.5 mg Oral Q1400  . vitamin C  1,000 mg Oral Daily   Continuous Infusions:         Aline August, MD [facility] 02/14/2019, 7:56 AM
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
## 2019-02-15 NOTE — Progress Notes (Signed)
Patient ID: Cameron West, male   DOB: 1955-09-08, 63 y.o.   MRN: [id number]  This NP visited patient at the bedside as a follow up for palliative medicine needs and emotional support.  Patient remains confused,   but is calm and cooperative today.  Spoke to son/Manraj Mercer by telephone for continued conversation regarding current medical situation.  At this time discussion is around transition of care for this patient.  The family verbalize their concern that they can no longer provide safe care for the patient in the home,  and are looking for care options.  Son verbalizes the many emotions he is experiencing in connection with this difficult situation for his father.   Social work is looking for long-term nursing options.  Recommend outpatient community-based palliative services at discharge.  Discussed with son the importance of continued conversation with his family and the  medical providers regarding overall plan of care and treatment options,  ensuring decisions are within the context of the patients values and GOCs.  Questions and concerns addressed   Discussed with Thomasene Lot LCSW  Total time spent on the unit was 25 minutes  Greater than 50% of the time was spent in counseling and coordination of care  Wadie Lessen NP  Palliative Medicine Team Team Phone # [phone] Pager [phone]

## 2019-02-15 NOTE — Progress Notes (Signed)
Physical Therapy Treatment Patient Details Name: Cameron West MRN: [id number] DOB: [birth year] Today's Date: 02/15/2019    History of Present Illness  63 y.o. male with medical history significant of advanced dementia, traumatic brain injury, CAD status post PCI, hypertension, hyperlipidemia presented to the ED for evaluation of hand injury. S/p irrigation debridement and digital nerve and artery repair with irrigation and debridement right hand.     PT Comments    Pt performed gt training during session this pm.  On arrival he is supine in bed speaking in nonsensical sentences.  He required constant redirection during session.  He responds best to simple commands. Pt given more complex cues to correct gt deviations but unable to follow.  Plan for SNF placement remains appropriate to improve balance and strength deficits.  Will address OT needs as RUE is very weak.     Follow Up Recommendations  SNF;Supervision/Assistance - 24 hour     Equipment Recommendations  None recommended by PT    Recommendations for Other Services       Precautions / Restrictions Precautions Precautions: Fall Restrictions Weight Bearing Restrictions: Yes RUE Weight Bearing: Weight bear through elbow only Other Position/Activity Restrictions: Verbal order per Amedeo Plenty, MD    Mobility  Bed Mobility Overal bed mobility: Needs Assistance Bed Mobility: Supine to Sit;Sit to Supine     Supine to sit: Supervision Sit to supine: Supervision   General bed mobility comments: Cues for hand placement and safety with RUE.  Pt with decreased strength in RUE and unable to raise without assistance into shoulder flexion.  Transfers Overall transfer level: Needs assistance Equipment used: None Transfers: Sit to/from Stand Sit to Stand: Min assist         General transfer comment: min assistance to move into standing.  Pt mildly unsteady and required L HHA to maintain standing.  Ambulation/Gait Ambulation/Gait  assistance: Min assist Gait Distance (Feet): 60 Feet(x2 trials.) Assistive device: None Gait Pattern/deviations: Step-through pattern;Drifts right/left;Decreased stride length;Trunk flexed Gait velocity: decreased   General Gait Details: Pt with mild ataxic gt and difficult to correct deviations due to base line dementia.  He presents with nonsensical conversation and required constant cues to redirect.  He kept attempting to enter multiple rooms.   Stairs             Wheelchair Mobility    Modified Rankin (Stroke Patients Only)       Balance Overall balance assessment: Needs assistance Sitting-balance support: No upper extremity supported;Feet supported Sitting balance-Leahy Scale: Fair       Standing balance-Leahy Scale: Poor Standing balance comment: Reliant on Min A for standing balance                            Cognition Arousal/Alertness: Awake/alert Behavior During Therapy: Flat affect Overall Cognitive Status: History of cognitive impairments - at baseline                                 General Comments: Baseline TBI and dementia. Pt following simple commands with increased time, unable to state name but when asked if his name was Chai, he responds, "yes." Asking to urinate despite having condom catheter on; decreased safety awareness and impulsive.      Exercises      General Comments        Pertinent Vitals/Pain Pain Assessment: Faces Faces Pain Scale: Hurts whole  lot Pain Descriptors / Indicators: Discomfort;Guarding;Grimacing Pain Intervention(s): Monitored during session;Repositioned    Home Living                      Prior Function            PT Goals (current goals can now be found in the care plan section) Acute Rehab PT Goals Patient Stated Goal: Not stated Potential to Achieve Goals: Fair Progress towards PT goals: Progressing toward goals    Frequency    Min 2X/week      PT Plan  Current plan remains appropriate    Co-evaluation              AM-PAC PT "6 Clicks" Mobility   Outcome Measure  Help needed turning from your back to your side while in a flat bed without using bedrails?: None Help needed moving from lying on your back to sitting on the side of a flat bed without using bedrails?: A Little Help needed moving to and from a bed to a chair (including a wheelchair)?: A Little Help needed standing up from a chair using your arms (e.g., wheelchair or bedside chair)?: A Little Help needed to walk in hospital room?: A Little Help needed climbing 3-5 steps with a railing? : A Little 6 Click Score: 19    End of Session Equipment Utilized During Treatment: Gait belt Activity Tolerance: Patient tolerated treatment well Patient left: in bed;with call bell/phone within reach;with bed alarm set Nurse Communication: Mobility status PT Visit Diagnosis: Unsteadiness on feet (R26.81)     Time: 8315-1761 PT Time Calculation (min) (ACUTE ONLY): 17 min  Charges:  $Gait Training: 8-22 mins                     Joycelyn Rua, PTA Acute Rehabilitation Services Pager [phone] Office [phone]     Amandeep Nesmith Artis Delay 02/15/2019, 3:08 PM

## 2019-02-15 NOTE — TOC Progression Note (Signed)
Transition of Care [facility]) - Progression Note    Patient Details  Name: Cameron West MRN: [id number] Date of Birth: 04-May-1955  Transition of Care [facility]) CM/SW [place], [place] Phone Number: 02/15/2019, 11:11 AM  Clinical Narrative:    At this time pt has no SNF offers; will f/u with pending SNF for placement.    Expected Discharge Plan: Long Term Nursing Home Barriers to Discharge: Continued Medical Work up  Expected Discharge Plan and Services Expected Discharge Plan: [place]   Social Determinants of Health (SDOH) Interventions    Readmission Risk Interventions No flowsheet data found.

## 2019-02-15 NOTE — Progress Notes (Signed)
Patient ID: Cameron West, male   DOB: 10-04-1955, 63 y.o.   MRN: [id number]  PROGRESS NOTE    Cameron West  JJO:[id number] DOB: 1955/12/24 DOA: 02/09/2019 PCP: Cameron Bark, FNP   Brief Narrative:  63 year old male with history of advanced dementia, traumatic brain injury, CAD status post PCI, hypertension, hyperlipidemia presented with hand injury.  Hand surgery was consulted and he underwent irrigation and debridement, radial digital nerve repair and artery repair and complex wound closure of the first webspace of the right hand on 02/09/2019.  Assessment & Plan:   Laceration of right hand -Status post  irrigation and debridement, radial digital nerve repair and artery repair and complex wound closure of the first webspace of the right hand on 02/09/2019. -Hand surgery following.  Wound care as per surgeon.  Antibiotics discontinued on 02/12/2019 by hand surgery.  Hypertensive urgency -Blood pressure improving.  Continue amlodipine.  Advanced dementia Agitation -We will use Haldol/Ativan as needed for agitation.  Palliative care evaluation for goals of care discussion is appreciated.  Continue memantine. -PT recommends SNF placement.  Social worker consulted. -Psychiatry evaluation appreciated.  Continue Seroquel and Depakote as per psychiatry recommendations.  Patient has been more calm after starting scheduled doses of Seroquel and adding Depakote.  Continue the same.  Still required Haldol last night.  Acute urinary retention -Continue in and out catheterization for now.  May need Foley if continues to have urinary retention  Hyperlipidemia -Continue statin.  DVT prophylaxis: SCDs Code Status: DNR  family Communication: None at bedside Disposition Plan: SNF once bed is available Consultants: Hand surgery  Procedures:   irrigation and debridement, radial digital nerve repair and artery repair and complex wound closure of the first webspace of the right hand on 02/09/2019.   Antimicrobials:  Anti-infectives (From admission, onward)   Start     Dose/Rate Route Frequency Ordered Stop   02/10/19 0600  ceFAZolin (ANCEF) IVPB 2g/100 mL premix  Status:  Discontinued     2 g 200 mL/hr over 30 Minutes Intravenous On call to O.R. 02/09/19 1955 02/10/19 0028   02/10/19 0600  ceFAZolin (ANCEF) IVPB 1 g/50 mL premix  Status:  Discontinued     1 g 100 mL/hr over 30 Minutes Intravenous Every 8 hours 02/10/19 0144 02/12/19 0753   02/10/19 0030  ceFAZolin (ANCEF) IVPB 1 g/50 mL premix     1 g 100 mL/hr over 30 Minutes Intravenous NOW 02/10/19 0027 02/10/19 0243       Subjective: Patient seen and examined at bedside.  Poor historian.  Patient required Haldol and Ativan again last night.  No overnight fever or vomiting reported. Objective: Vitals:   02/14/19 0525 02/14/19 1309 02/14/19 2103 02/15/19 0539  BP: (!) 104/56 110/71 127/76 (!) 141/90  Pulse: 61 64 64 (!) 58  Resp: 17 18 16 17   Temp: 98 F (36.7 C) 98.8 F (37.1 C) 99.4 F (37.4 C) 99 F (37.2 C)  TempSrc: Oral Oral Oral Oral  SpO2: 100% 100% 99% 100%  Weight:      Height:        Intake/Output Summary (Last 24 hours) at 02/15/2019 0806 Last data filed at 02/15/2019 0400 Gross per 24 hour  Intake 240 ml  Output 700 ml  Net -460 ml   Filed Weights   02/09/19 1123 02/10/19 2214 02/13/19 0408  Weight: 86.2 kg 86 kg 89 kg    Examination:  General exam: No acute distress.  No signs of agitation.  Sleepy, hardly  wakes up on calling his name.   Respiratory system: Bilateral decreased breath sounds at bases with some scattered crackles Cardiovascular system: Rate controlled, S1-S2 heard Gastrointestinal system: Abdomen is nondistended, soft and nontender. Normal bowel sounds heard. Extremities: No cyanosis.  Right hand dressing present  Data Reviewed: I have personally reviewed following labs and imaging studies  CBC: Recent Labs  Lab 02/09/19 1320 02/11/19 0243  WBC 8.1 5.1  NEUTROABS 5.6  2.7  HGB 14.2 13.8  HCT 43.8 39.9  MCV 93.6 87.9  PLT 193 419   Basic Metabolic Panel: Recent Labs  Lab 02/09/19 1320 02/11/19 0243  NA 141 140  K 3.6 3.3*  CL 110 106  CO2 23 26  GLUCOSE 91 96  BUN 24* 9  CREATININE 0.61 0.60*  CALCIUM 9.3 9.4  MG  --  1.9   GFR: Estimated Creatinine Clearance: 112.7 mL/min (A) (by C-G formula based on SCr of 0.6 mg/dL (L)). Liver Function Tests: Recent Labs  Lab 02/11/19 0243  AST 26  ALT 32  ALKPHOS 68  BILITOT 0.5  PROT 6.4*  ALBUMIN 3.7   No results for input(s): LIPASE, AMYLASE in the last 168 hours. No results for input(s): AMMONIA in the last 168 hours. Coagulation Profile: No results for input(s): INR, PROTIME in the last 168 hours. Cardiac Enzymes: No results for input(s): CKTOTAL, CKMB, CKMBINDEX, TROPONINI in the last 168 hours. BNP (last 3 results) No results for input(s): PROBNP in the last 8760 hours. HbA1C: No results for input(s): HGBA1C in the last 72 hours. CBG: Recent Labs  Lab 02/10/19 1154  GLUCAP 93   Lipid Profile: No results for input(s): CHOL, HDL, LDLCALC, TRIG, CHOLHDL, LDLDIRECT in the last 72 hours. Thyroid Function Tests: No results for input(s): TSH, T4TOTAL, FREET4, T3FREE, THYROIDAB in the last 72 hours. Anemia Panel: No results for input(s): VITAMINB12, FOLATE, FERRITIN, TIBC, IRON, RETICCTPCT in the last 72 hours. Sepsis Labs: No results for input(s): PROCALCITON, LATICACIDVEN in the last 168 hours.  Recent Results (from the past 240 hour(s))  SARS Coronavirus 2 by RT PCR (hospital order, performed in [facility] hospital lab) Nasopharyngeal Nasopharyngeal Swab     Status: None   Collection Time: 02/09/19  3:31 PM   Specimen: Nasopharyngeal Swab  Result Value Ref Range Status   SARS Coronavirus 2 NEGATIVE NEGATIVE Final    Comment: (NOTE) If result is NEGATIVE SARS-CoV-2 target nucleic acids are NOT DETECTED. The SARS-CoV-2 RNA is generally detectable in upper and lower   respiratory specimens during the acute phase of infection. The lowest  concentration of SARS-CoV-2 viral copies this assay can detect is 250  copies / mL. A negative result does not preclude SARS-CoV-2 infection  and should not be used as the sole basis for treatment or other  patient management decisions.  A negative result may occur with  improper specimen collection / handling, submission of specimen other  than nasopharyngeal swab, presence of viral mutation(s) within the  areas targeted by this assay, and inadequate number of viral copies  (<250 copies / mL). A negative result must be combined with clinical  observations, patient history, and epidemiological information. If result is POSITIVE SARS-CoV-2 target nucleic acids are DETECTED. The SARS-CoV-2 RNA is generally detectable in upper and lower  respiratory specimens dur ing the acute phase of infection.  Positive  results are indicative of active infection with SARS-CoV-2.  Clinical  correlation with patient history and other diagnostic information is  necessary to determine patient infection status.  Positive results do  not rule out bacterial infection or co-infection with other viruses. If result is PRESUMPTIVE POSTIVE SARS-CoV-2 nucleic acids MAY BE PRESENT.   A presumptive positive result was obtained on the submitted specimen  and confirmed on repeat testing.  While 2019 novel coronavirus  (SARS-CoV-2) nucleic acids may be present in the submitted sample  additional confirmatory testing may be necessary for epidemiological  and / or clinical management purposes  to differentiate between  SARS-CoV-2 and other Sarbecovirus currently known to infect humans.  If clinically indicated additional testing with an alternate test  methodology [phone] is advised. The SARS-CoV-2 RNA is generally  detectable in upper and lower respiratory sp ecimens during the acute  phase of infection. The expected result is Negative. Fact  Sheet for Patients:  BoilerBrush.com.cyhttps://www.fda.gov/media/136312/download Fact Sheet for Healthcare Providers: https://pope.com/https://www.fda.gov/media/136313/download This test is not yet approved or cleared by the [place] FDA and has been authorized for detection and/or diagnosis of SARS-CoV-2 by FDA under an Emergency Use Authorization (EUA).  This EUA will remain in effect (meaning this test can be used) for the duration of the COVID-19 declaration under Section 564(b)(1) of the Act, 21 U.S.C. section 360bbb-3(b)(1), unless the authorization is terminated or revoked sooner. Performed at [facility], [address] [place], [place] [address]          Radiology Studies: No results found.      Scheduled Meds: . amLODipine  10 mg Oral Daily  . atorvastatin  40 mg Oral Daily  . divalproex  125 mg Oral BID  . feeding supplement (ENSURE ENLIVE)  237 mL Oral BID BM  . feeding supplement (PRO-STAT SUGAR FREE 64)  30 mL Oral BID  . memantine  10 mg Oral BID  . multivitamin  1 tablet Oral Daily  . QUEtiapine  100 mg Oral QHS  . QUEtiapine  12.5 mg Oral QAC breakfast  . QUEtiapine  12.5 mg Oral Q1400  . vitamin C  1,000 mg Oral Daily   Continuous Infusions:         Glade LloydKshitiz Franziska Podgurski, MD [facility] 02/15/2019, 8:06 AM

## 2019-02-15 NOTE — Progress Notes (Signed)
Occupational Therapy Treatment Patient Details Name: Cameron West MRN: [id number] DOB: 12/23/1955 Today's Date: 02/15/2019    History of present illness  63 y.o. male with medical history significant of advanced dementia, traumatic brain injury, CAD status post PCI, hypertension, hyperlipidemia presented to the ED for evaluation of hand injury. S/p irrigation debridement and digital nerve and artery repair with irrigation and debridement right hand.    OT comments  Upon arrival, pt was supine and resting in bed. Pt more conversational this session and following simple commands.  Focused session on ROM and movement of RUE. Pt grimacing with elbow flexion and reporting pain. Tolerating AAROM at R wrist, elbow, and shoulder; 10 reps each. Continue to recommend dc to SNF and will continue to follow.    Follow Up Recommendations  SNF;Supervision/Assistance - 24 hour(Will require 24/7 assist)    Equipment Recommendations  Other (comment)(Unsure of DME at home)    Recommendations for Other Services      Precautions / Restrictions Precautions Precautions: Fall Restrictions Weight Bearing Restrictions: Yes RUE Weight Bearing: Weight bear through elbow only Other Position/Activity Restrictions: Verbal order per Amanda Pea, MD       Mobility Bed Mobility         Transfers             Balance Overall balance assessment: Needs assistance Sitting-balance support: No upper extremity supported;Feet supported Sitting balance-Leahy Scale: Fair       Standing balance-Leahy Scale: Poor Standing balance comment: Reliant on Min A for standing balance                           ADL either performed or assessed with clinical judgement   ADL Overall ADL's : Needs assistance/impaired                                       General ADL Comments: Focused session on RUE ROM      Vision       Perception     Praxis      Cognition Arousal/Alertness:  Awake/alert Behavior During Therapy: Flat affect Overall Cognitive Status: History of cognitive impairments - at baseline                                 General Comments: Baseline TBI and dementia. Pt more conversation this session (even through his thoughts are still tangential). Pt calmly answering "yes, mam" and no"ma'am" to simple questions. Grimacing with ROM of right elbow and reaching over to say it "hurts right here".        Exercises     Shoulder Instructions       General Comments      Pertinent Vitals/ Pain       Pain Assessment: Faces Faces Pain Scale: Hurts whole lot Pain Location: movement of right elbow Pain Descriptors / Indicators: Discomfort;Guarding;Grimacing Pain Intervention(s): Monitored during session;Repositioned  Home Living                                          Prior Functioning/Environment              Frequency  Min 2X/week        Progress Toward  Goals  OT Goals(current goals can now be found in the care plan section)  Progress towards OT goals: Progressing toward goals  Acute Rehab OT Goals Patient Stated Goal: Not stated OT Goal Formulation: Patient unable to participate in goal setting Time For Goal Achievement: 02/24/19 Potential to Achieve Goals: Good ADL Goals Pt Will Perform Grooming: with min guard assist;standing Pt Will Transfer to Toilet: with min guard assist;bedside commode Additional ADL Goal #1: Pt will follow simple, one step cues 50% of time for ADLs  Plan Discharge plan remains appropriate    Co-evaluation                 AM-PAC OT "6 Clicks" Daily Activity     Outcome Measure   Help from another person eating meals?: A Little Help from another person taking care of personal grooming?: A Little Help from another person toileting, which includes using toliet, bedpan, or urinal?: A Little Help from another person bathing (including washing, rinsing, drying)?: A  Little Help from another person to put on and taking off regular upper body clothing?: A Little Help from another person to put on and taking off regular lower body clothing?: A Little 6 Click Score: 18    End of Session    OT Visit Diagnosis: Unsteadiness on feet (R26.81);Other abnormalities of gait and mobility (R26.89);Muscle weakness (generalized) (M62.81);Other symptoms and signs involving cognitive function   Activity Tolerance Patient tolerated treatment well   Patient Left in bed;with call bell/phone within reach;with bed alarm set   Nurse Communication Mobility status        Time: 1950-9326 OT Time Calculation (min): 19 min  Charges: OT General Charges $OT Visit: 1 Visit OT Treatments $Therapeutic Exercise: 8-22 mins  [place], OTR/L Acute Rehab Pager: [phone] Office: [place] 02/15/2019, 4:23 PM

## 2019-02-15 NOTE — Progress Notes (Signed)
  Speech Language Pathology Treatment: Dysphagia  Patient Details Name: KIZER NOBBE MRN: [id number] DOB: 1956/02/23 Today's Date: 02/15/2019 Time: 1000-1016 SLP Time Calculation (min) (ACUTE ONLY): 16 min  Assessment / Plan / Recommendation Clinical Impression  Pt asleep on SLP arrival, but easily roused and able to maintain alertness for PO trials.  RN reports good tolerance of current diet. Today pt tolerated regular solids, puree, and thin liquid with no overt s/s of aspiration.  Pt exhibited adequate oral clearance of regular solids.  There was some mild oral residue, but this was cleared with liquid was and subsequent PO trials.  Pt was able to feed himself regular solid cracker; however, pt will like need 1:1 assistance with meals, given limited use of R hand.  Pt may be able to eat some finger foods independently. SLP to follow for diet tolerance.  Recommend advancing diet to regular solids with thin liquid.  Medications can be crushed in puree or administered as tolerated.     HPI HPI: OMAURI BOEVE is a 63 y.o. male with medical history significant of advanced dementia, traumatic brain injury, CAD status post PCI, hypertension, hyperlipidemia presented to the ED for evaluation of hand injury.  Family reported that patient fell while holding a drinking glass and lacerated his hand.  Now s/p surgical repair. CXR 11/4: lungs clear.      SLP Plan  Continue with current plan of care       Recommendations  Diet recommendations: Regular;Thin liquid Liquids provided via: Cup;Straw Medication Administration: Crushed with puree(as tolerated) Supervision: Full supervision/cueing for compensatory strategies;Trained caregiver to feed patient Compensations: Minimize environmental distractions;Slow rate;Small sips/bites Postural Changes and/or Swallow Maneuvers: Seated upright 90 degrees;Upright 30-60 min after meal                Oral Care Recommendations: Oral care QID Follow up  Recommendations: 24 hour supervision/assistance SLP Visit Diagnosis: Dysphagia, unspecified (R13.10) Plan: Continue with current plan of care       [place], [place], [place] Office: [phone] 02/15/2019, 10:37 AM

## 2019-02-16 ENCOUNTER — Telehealth: Payer: Medicare Other | Admitting: Family Medicine

## 2019-02-16 MED ORDER — ENOXAPARIN SODIUM 40 MG/0.4ML ~~LOC~~ SOLN
40.0000 mg | SUBCUTANEOUS | Status: DC
  Administered 2019-02-16 – 2019-02-26 (×11): 40 mg via SUBCUTANEOUS
  Filled 2019-02-16 (×11): qty 0.4

## 2019-02-16 NOTE — Progress Notes (Signed)
Occupational Therapy Treatment Patient Details Name: Cameron West MRN: [id number] DOB: 1956-04-05 Today's Date: 02/16/2019    History of present illness  63 y.o. male with medical history significant of advanced dementia, traumatic brain injury, CAD status post PCI, hypertension, hyperlipidemia presented to the ED for evaluation of hand injury. S/p irrigation debridement and digital nerve and artery repair with irrigation and debridement right hand.    OT comments  Patient semi-supine in bed upon arrival. Patient is tangential throughout requiring max verbal, visual and tactile cues to redirect to task. Attempt to have patient sit edge of bed to eat lunch however patient declined "I'm not hungry." While semi-supine in bed completed AAROM B shoulder flexion, elbow flexion/extension x10 reps. Patient then states "I need the toilet." Attempts to get out on side of bed that has the rail up, requires redirection and assist with lower extremities. Hand held min A to ambulate to/from rest room and min A for safety with catheter as patient perseverates on this and tries to pull on it.   Follow Up Recommendations  SNF;Supervision/Assistance - 24 hour    Equipment Recommendations  Other (comment)(defer to next venue)       Precautions / Restrictions Precautions Precautions: Fall Restrictions Weight Bearing Restrictions: Yes RUE Weight Bearing: Weight bear through elbow only Other Position/Activity Restrictions: Verbal order per Amedeo Plenty, MD       Mobility Bed Mobility Overal bed mobility: Needs Assistance Bed Mobility: Supine to Sit;Sit to Supine     Supine to sit: Min guard Sit to supine: Min guard   General bed mobility comments: min guard for redirection as patient attempts getting out of the bed on the incorrect side. patient then attempt to lay down towards foot of the bed requiring redirection  Transfers Overall transfer level: Needs assistance Equipment used: None;1 person hand held  assist Transfers: Sit to/from Stand Sit to Stand: Min assist              Balance Overall balance assessment: Needs assistance Sitting-balance support: No upper extremity supported;Feet supported Sitting balance-Leahy Scale: Fair     Standing balance support: Single extremity supported Standing balance-Leahy Scale: Poor Standing balance comment: hand held assistance for safety                           ADL either performed or assessed with clinical judgement   ADL Overall ADL's : Needs assistance/impaired                         Toilet Transfer: Minimal assistance;Ambulation;Regular Toilet;Cueing for safety   Toileting- Clothing Manipulation and Hygiene: Minimal assistance;Cueing for safety;Sit to/from stand Toileting - Clothing Manipulation Details (indicate cue type and reason): max verbal cues to redirect to bathroom in order for patient to urinate. patient perseverates on catheter bag     Functional mobility during ADLs: Minimal assistance;Cueing for safety;Cueing for sequencing                 Cognition Arousal/Alertness: Awake/alert Behavior During Therapy: Restless Overall Cognitive Status: History of cognitive impairments - at baseline                                 General Comments: baseline TBI and dementia. patient tangential throughout session        Exercises Exercises: General Upper Extremity General Exercises - Upper Extremity Shoulder Flexion:  AAROM;10 reps;Both Elbow Flexion: AAROM;10 reps;Both Elbow Extension: AAROM;Both;10 reps           Pertinent Vitals/ Pain       Pain Assessment: Faces Faces Pain Scale: No hurt         Frequency  Min 2X/week        Progress Toward Goals  OT Goals(current goals can now be found in the care plan section)  Patient progressing towards goals.  Acute Rehab OT Goals Patient Stated Goal: did not state OT Goal Formulation: Patient unable to participate in  goal setting Time For Goal Achievement: 02/24/19 Potential to Achieve Goals: Good ADL Goals Pt Will Perform Grooming: with min guard assist;standing Pt Will Transfer to Toilet: with min guard assist;bedside commode Additional ADL Goal #1: Pt will follow simple, one step cues 50% of time for ADLs  Plan Discharge plan remains appropriate       AM-PAC OT "6 Clicks" Daily Activity     Outcome Measure   Help from another person eating meals?: A Little Help from another person taking care of personal grooming?: A Little Help from another person toileting, which includes using toliet, bedpan, or urinal?: A Little Help from another person bathing (including washing, rinsing, drying)?: A Little Help from another person to put on and taking off regular upper body clothing?: A Little Help from another person to put on and taking off regular lower body clothing?: A Little 6 Click Score: 18    End of Session OT Visit Diagnosis: Unsteadiness on feet (R26.81);Other abnormalities of gait and mobility (R26.89);Muscle weakness (generalized) (M62.81);Other symptoms and signs involving cognitive function   Activity Tolerance Patient tolerated treatment well   Patient Left in bed;with call bell/phone within reach;with bed alarm set   Nurse Communication Mobility status        Time: 3007-6226 OT Time Calculation (min): 28 min  Charges: OT General Charges $OT Visit: 1 Visit OT Treatments $Self Care/Home Management : 8-22 mins $Therapeutic Exercise: 8-22 mins  Myrtie Neither OT OT office: [phone]   Carmelia Roller 02/16/2019, 2:09 PM

## 2019-02-16 NOTE — TOC Progression Note (Signed)
Transition of Care [facility]) - Progression Note    Patient Details  Name: KASSIUS BATTISTE MRN: [id number] Date of Birth: Jul 30, 1955  [facility] of [facility]) CM/SW [place], [place] Phone Number: 02/16/2019, 11:06 AM  Clinical Narrative:    Discussed with Stanton Kidney, NP with PMT barriers to placement at this time including but not limited to: LTC bed request from family, TBI, medication needs (haldol and ativan). We discussed that TOC continues to search for placement but that we may be forced to search further than [facility] for placement at this time.    Expected Discharge Plan: Long Term Nursing Home Barriers to Discharge: Continued Medical Work up  Expected Discharge Plan and Services Expected Discharge Plan: [place]    Social Determinants of Health (SDOH) Interventions    Readmission Risk Interventions No flowsheet data found.

## 2019-02-16 NOTE — Progress Notes (Signed)
Patient ID: RASHAWD LASKARIS, male   DOB: 07-14-1955, 63 y.o.   MRN: [id number]  PROGRESS NOTE    NEVILLE WALSTON  YOV:[id number] DOB: 1955-05-29 DOA: 02/09/2019 PCP: Alfonse Spruce, FNP   Brief Narrative:  63 year old male with history of advanced dementia, traumatic brain injury, CAD status post PCI, hypertension, hyperlipidemia presented with hand injury.  Hand surgery was consulted and he underwent irrigation and debridement, radial digital nerve repair and artery repair and complex wound closure of the first webspace of the right hand on 02/09/2019.  Assessment & Plan:   Laceration of right hand -Status post  irrigation and debridement, radial digital nerve repair and artery repair and complex wound closure of the first webspace of the right hand on 02/09/2019. -Hand surgery following.  Wound care as per surgeon.  Antibiotics discontinued on 02/12/2019 by hand surgery.  Hypertensive urgency -Blood pressure improving.  Continue amlodipine.  Advanced dementia Agitation -We will use Haldol/Ativan as needed for agitation.  Palliative care evaluation for goals of care discussion is appreciated.  Continue memantine. -PT recommends SNF placement.  Social worker consulted. -Psychiatry evaluation appreciated.  Continue Seroquel and Depakote as per psychiatry recommendations.  Patient has been more calm after starting scheduled doses of Seroquel and adding Depakote.  Continue the same.    Acute urinary retention -Continue in and out catheterization for now.  May need Foley if continues to have urinary retention  Hyperlipidemia -Continue statin.  DVT prophylaxis: SCDs.  Start Lovenox. Code Status: DNR  family Communication: None at bedside Disposition Plan: SNF once bed is available Consultants: Hand surgery  Procedures:   irrigation and debridement, radial digital nerve repair and artery repair and complex wound closure of the first webspace of the right hand on 02/09/2019.  Antimicrobials:   Anti-infectives (From admission, onward)   Start     Dose/Rate Route Frequency Ordered Stop   02/10/19 0600  ceFAZolin (ANCEF) IVPB 2g/100 mL premix  Status:  Discontinued     2 g 200 mL/hr over 30 Minutes Intravenous On call to O.R. 02/09/19 1955 02/10/19 0028   02/10/19 0600  ceFAZolin (ANCEF) IVPB 1 g/50 mL premix  Status:  Discontinued     1 g 100 mL/hr over 30 Minutes Intravenous Every 8 hours 02/10/19 0144 02/12/19 0753   02/10/19 0030  ceFAZolin (ANCEF) IVPB 1 g/50 mL premix     1 g 100 mL/hr over 30 Minutes Intravenous NOW 02/10/19 0027 02/10/19 0243       Subjective: Patient seen and examined at bedside.  Poor historian.  No fever or vomiting reported overnight. Objective: Vitals:   02/15/19 1404 02/15/19 2147 02/16/19 0345 02/16/19 0529  BP: 127/85 138/89  (!) 132/93  Pulse: 82 73  73  Resp: 18 18  16   Temp: 97.9 F (36.6 C) 98.4 F (36.9 C)  97.9 F (36.6 C)  TempSrc: Oral     SpO2: 100% 99%  99%  Weight:   88 kg   Height:        Intake/Output Summary (Last 24 hours) at 02/16/2019 0807 Last data filed at 02/15/2019 1720 Gross per 24 hour  Intake 320 ml  Output 1150 ml  Net -830 ml   Filed Weights   02/10/19 2214 02/13/19 0408 02/16/19 0345  Weight: 86 kg 89 kg 88 kg    Examination:  General exam: No distress.  No signs of agitation.  Awake but hardly answers any questions.   Respiratory system: Bilateral decreased breath sounds at bases, no  wheezing  cardiovascular system: S1-S2 heard, rate controlled Gastrointestinal system: Abdomen is nondistended, soft and nontender. Normal bowel sounds heard. Extremities: No cyanosis.  Right hand dressing present  Data Reviewed: I have personally reviewed following labs and imaging studies  CBC: Recent Labs  Lab 02/09/19 1320 02/11/19 0243  WBC 8.1 5.1  NEUTROABS 5.6 2.7  HGB 14.2 13.8  HCT 43.8 39.9  MCV 93.6 87.9  PLT 193 174   Basic Metabolic Panel: Recent Labs  Lab 02/09/19 1320 02/11/19 0243   NA 141 140  K 3.6 3.3*  CL 110 106  CO2 23 26  GLUCOSE 91 96  BUN 24* 9  CREATININE 0.61 0.60*  CALCIUM 9.3 9.4  MG  --  1.9   GFR: Estimated Creatinine Clearance: 112.7 mL/min (A) (by C-G formula based on SCr of 0.6 mg/dL (L)). Liver Function Tests: Recent Labs  Lab 02/11/19 0243  AST 26  ALT 32  ALKPHOS 68  BILITOT 0.5  PROT 6.4*  ALBUMIN 3.7   No results for input(s): LIPASE, AMYLASE in the last 168 hours. No results for input(s): AMMONIA in the last 168 hours. Coagulation Profile: No results for input(s): INR, PROTIME in the last 168 hours. Cardiac Enzymes: No results for input(s): CKTOTAL, CKMB, CKMBINDEX, TROPONINI in the last 168 hours. BNP (last 3 results) No results for input(s): PROBNP in the last 8760 hours. HbA1C: No results for input(s): HGBA1C in the last 72 hours. CBG: Recent Labs  Lab 02/10/19 1154  GLUCAP 93   Lipid Profile: No results for input(s): CHOL, HDL, LDLCALC, TRIG, CHOLHDL, LDLDIRECT in the last 72 hours. Thyroid Function Tests: No results for input(s): TSH, T4TOTAL, FREET4, T3FREE, THYROIDAB in the last 72 hours. Anemia Panel: No results for input(s): VITAMINB12, FOLATE, FERRITIN, TIBC, IRON, RETICCTPCT in the last 72 hours. Sepsis Labs: No results for input(s): PROCALCITON, LATICACIDVEN in the last 168 hours.  Recent Results (from the past 240 hour(s))  SARS Coronavirus 2 by RT PCR (hospital order, performed in [facility] hospital lab) Nasopharyngeal Nasopharyngeal Swab     Status: None   Collection Time: 02/09/19  3:31 PM   Specimen: Nasopharyngeal Swab  Result Value Ref Range Status   SARS Coronavirus 2 NEGATIVE NEGATIVE Final    Comment: (NOTE) If result is NEGATIVE SARS-CoV-2 target nucleic acids are NOT DETECTED. The SARS-CoV-2 RNA is generally detectable in upper and lower  respiratory specimens during the acute phase of infection. The lowest  concentration of SARS-CoV-2 viral copies this assay can detect is 250  copies  / mL. A negative result does not preclude SARS-CoV-2 infection  and should not be used as the sole basis for treatment or other  patient management decisions.  A negative result may occur with  improper specimen collection / handling, submission of specimen other  than nasopharyngeal swab, presence of viral mutation(s) within the  areas targeted by this assay, and inadequate number of viral copies  (<250 copies / mL). A negative result must be combined with clinical  observations, patient history, and epidemiological information. If result is POSITIVE SARS-CoV-2 target nucleic acids are DETECTED. The SARS-CoV-2 RNA is generally detectable in upper and lower  respiratory specimens dur ing the acute phase of infection.  Positive  results are indicative of active infection with SARS-CoV-2.  Clinical  correlation with patient history and other diagnostic information is  necessary to determine patient infection status.  Positive results do  not rule out bacterial infection or co-infection with other viruses. If result is  PRESUMPTIVE POSTIVE SARS-CoV-2 nucleic acids MAY BE PRESENT.   A presumptive positive result was obtained on the submitted specimen  and confirmed on repeat testing.  While 2019 novel coronavirus  (SARS-CoV-2) nucleic acids may be present in the submitted sample  additional confirmatory testing may be necessary for epidemiological  and / or clinical management purposes  to differentiate between  SARS-CoV-2 and other Sarbecovirus currently known to infect humans.  If clinically indicated additional testing with an alternate test  methodology [phone]([id number]) is advised. The SARS-CoV-2 RNA is generally  detectable in upper and lower respiratory sp ecimens during the acute  phase of infection. The expected result is Negative. Fact Sheet for Patients:  BoilerBrush.com.cyhttps://www.fda.gov/media/136312/download Fact Sheet for Healthcare Providers: https://pope.com/https://www.fda.gov/media/136313/download This test  is not yet approved or cleared by the [place] FDA and has been authorized for detection and/or diagnosis of SARS-CoV-2 by FDA under an Emergency Use Authorization (EUA).  This EUA will remain in effect (meaning this test can be used) for the duration of the COVID-19 declaration under Section 564(b)(1) of the Act, 21 U.S.C. section 360bbb-3(b)(1), unless the authorization is terminated or revoked sooner. Performed at [facility], [address] [place], [place] [address]          Radiology Studies: No results found.      Scheduled Meds: . amLODipine  10 mg Oral Daily  . atorvastatin  40 mg Oral Daily  . divalproex  125 mg Oral BID  . feeding supplement (ENSURE ENLIVE)  237 mL Oral BID BM  . feeding supplement (PRO-STAT SUGAR FREE 64)  30 mL Oral BID  . memantine  10 mg Oral BID  . multivitamin  1 tablet Oral Daily  . QUEtiapine  100 mg Oral QHS  . QUEtiapine  12.5 mg Oral QAC breakfast  . QUEtiapine  12.5 mg Oral Q1400  . vitamin C  1,000 mg Oral Daily   Continuous Infusions:         Glade LloydKshitiz Datron Brakebill, MD [facility] 02/16/2019, 8:07 AM

## 2019-02-17 DIAGNOSIS — E785 Hyperlipidemia, unspecified: Secondary | ICD-10-CM

## 2019-02-17 DIAGNOSIS — R451 Restlessness and agitation: Secondary | ICD-10-CM

## 2019-02-17 NOTE — Progress Notes (Signed)
Physical Therapy Treatment Patient Details Name: Cameron West MRN: [id number] DOB: [birth year] Today's Date: 02/17/2019    History of Present Illness  63 y.o. male with medical history significant of advanced dementia, traumatic brain injury, CAD status post PCI, hypertension, hyperlipidemia presented to the ED for evaluation of hand injury. S/p irrigation debridement and digital nerve and artery repair with irrigation and debridement right hand.     PT Comments    Pt performed gt training and functional mobility during session.  He continues to lack ability to follow commands during session due to advanced dementia.  He requires multimodal cues to achieve completion of task. Pt continues to benefit from SNF placement at d/c to improve strength and function.    Follow Up Recommendations  SNF;Supervision/Assistance - 24 hour     Equipment Recommendations  None recommended by PT    Recommendations for Other Services       Precautions / Restrictions Precautions Precautions: Fall Restrictions Weight Bearing Restrictions: Yes RUE Weight Bearing: Weight bear through elbow only Other Position/Activity Restrictions: Verbal order per Amanda Pea, MD    Mobility  Bed Mobility Overal bed mobility: Needs Assistance Bed Mobility: Supine to Sit     Supine to sit: Supervision     General bed mobility comments: Basic commands to move to edge of bed.  Transfers Overall transfer level: Needs assistance Equipment used: None;1 person hand held assist Transfers: Sit to/from Stand Sit to Stand: Min assist;Supervision         General transfer comment: required various levels of assistance due to dementia. From bed required min assistance to engage in activity.  Pt performed transfer from commode with close supervision.  Ambulation/Gait Ambulation/Gait assistance: Min guard;Min assist Gait Distance (Feet): 120 Feet Assistive device: 1 person hand held assist Gait Pattern/deviations:  Step-through pattern;Drifts right/left;Decreased stride length;Trunk flexed Gait velocity: decreased   General Gait Details: Pt with R sided inattention and cues for obstacle negotiation.  Pt required cues for upper trunk control and to correct weight shifting to improve balance.   Stairs             Wheelchair Mobility    Modified Rankin (Stroke Patients Only)       Balance Overall balance assessment: Needs assistance Sitting-balance support: No upper extremity supported;Feet supported Sitting balance-Leahy Scale: Fair       Standing balance-Leahy Scale: Poor Standing balance comment: hand held assistance for safety                            Cognition Arousal/Alertness: Awake/alert Behavior During Therapy: WFL for tasks assessed/performed Overall Cognitive Status: History of cognitive impairments - at baseline                                 General Comments: baseline TBI and dementia. patient tangential throughout session      Exercises      General Comments        Pertinent Vitals/Pain Pain Assessment: No/denies pain Faces Pain Scale: Hurts little more Pain Location: movement of right elbow Pain Descriptors / Indicators: Guarding;Grimacing;Discomfort Pain Intervention(s): Monitored during session;Repositioned    Home Living                      Prior Function            PT Goals (current goals can now be found in  the care plan section) Acute Rehab PT Goals Patient Stated Goal: did not state Potential to Achieve Goals: Fair Progress towards PT goals: Progressing toward goals    Frequency    Min 2X/week      PT Plan Current plan remains appropriate    Co-evaluation              AM-PAC PT "6 Clicks" Mobility   Outcome Measure  Help needed turning from your back to your side while in a flat bed without using bedrails?: None Help needed moving from lying on your back to sitting on the side of a  flat bed without using bedrails?: A Little Help needed moving to and from a bed to a chair (including a wheelchair)?: A Little Help needed standing up from a chair using your arms (e.g., wheelchair or bedside chair)?: A Little Help needed to walk in hospital room?: A Little Help needed climbing 3-5 steps with a railing? : A Little 6 Click Score: 19    End of Session Equipment Utilized During Treatment: Gait belt Activity Tolerance: Patient tolerated treatment well Patient left: in bed;with call bell/phone within reach;with bed alarm set Nurse Communication: Mobility status PT Visit Diagnosis: Unsteadiness on feet (R26.81)     Time: 6073-7106 PT Time Calculation (min) (ACUTE ONLY): 26 min  Charges:  $Gait Training: 8-22 mins $Therapeutic Activity: 8-22 mins                     Erasmo Leventhal , PTA Acute Rehabilitation Services Pager [phone] Office [phone]     Dasia Guerrier Eli Hose 02/17/2019, 3:58 PM

## 2019-02-17 NOTE — Progress Notes (Signed)
Patient ID: Cameron West, male   DOB: June 17, 1955, 63 y.o.   MRN: [id number] Patient seen at bedside.  He is 1 week status post surgical I&D and repair.  At present time we are still awaiting placement.  We have evaluated his bandage today which looks clean and dry.  I will remove the volar splint and simply place a dry dressing on the area with Kling gauze and Ace wrap.  We can change this as often as necessary but I would like to wait another 5 to 7 days until we removed stitches.  Jonquil Stubbe MD cell phone [phone]

## 2019-02-17 NOTE — Progress Notes (Signed)
Pt removed dressing to his right hand. Wound unremarkable, no drainage or signs of infection. Dry guaze placed over the wound, secured with kerlex, splint applied and secured with Ace wrap. Will continue to monitor pt.

## 2019-02-17 NOTE — Progress Notes (Signed)
PROGRESS NOTE    Cameron West  YQI:[id number] DOB: December 20, 1955 DOA: 02/09/2019 PCP: Cameron West   Brief Narrative:  HPI on 02/09/2019 by Cameron West is a 63 y.o. male with medical history significant of advanced dementia, traumatic brain injury, CAD status post PCI, hypertension, hyperlipidemia presented to the ED for evaluation of Cameron injury.  Family reported that patient fell while holding a drinking glass and lacerated his Cameron.  X-ray showing no foreign body.  Cameron West was consulted.  Patient was taken to the OR and discovered to have radial digital nerve and artery injury to the index finger as well as significant thenar and abductor muscle injury.  He underwent irrigation and debridement, radial digital nerve and artery repair, and complex wound closure of the first webspace of the right Cameron.  Cameron West requested admission for observation under hospitalist service as patient noted to be hypertensive and bladder scan revealed >600 cc urine for which in and out cath was ordered.  Recommended continuing antibiotic for 24 hours.  Patient is currently very somnolent and no history could be obtained from him.  Interim history Noted with laceration of the right Cameron, status post I&D, radial digital nerve repair, artery repair.  Now pending SNF placement. Assessment & Plan   Laceration of the right Cameron -Orthopedic/Cameron West consulted and appreciated -Status post irrigation and debridement, radial digital nerve repair, artery repair, complex wound closure of the first webspace of the right Cameron on 02/09/2019 -Continue wound care per West -Antibiotics were discontinued 02/12/2019 but Cameron West  Hypertensive urgency -Continue amlodipine, BP now more stable  Advanced dementia/agitation -New Haldol/Ativan as needed for agitation -Palliative care consulted and appreciated to discuss goals of care -Continue memantine -Psychiatry consulted and  appreciated, continue Seroquel and Depakote per psychiatry recommendations  Deconditioning -PT recommended SNF placement -Social work consulted  Acute urinary retention -Continue now catheterizations for now  Hyperlipidemia -Continue statin  DVT Prophylaxis  SCDs/ Lovenox  Code Status: DNR  Family Communication: None at bedside  Disposition Plan: Admitted. Pending SNF placement.  Consultants Cameron West, Cameron West  Procedures  Irrigation and debridement, radial digital nerve repair and artery repair and complex wound closure of the first webspace of the right Cameron on 02/09/2019  Antibiotics   Anti-infectives (From admission, onward)   Start     Dose/Rate Route Frequency Ordered Stop   02/10/19 0600  ceFAZolin (ANCEF) IVPB 2g/100 mL premix  Status:  Discontinued     2 g 200 mL/hr over 30 Minutes Intravenous On call to O.R. 02/09/19 1955 02/10/19 0028   02/10/19 0600  ceFAZolin (ANCEF) IVPB 1 g/50 mL premix  Status:  Discontinued     1 g 100 mL/hr over 30 Minutes Intravenous Every 8 hours 02/10/19 0144 02/12/19 0753   02/10/19 0030  ceFAZolin (ANCEF) IVPB 1 g/50 mL premix     1 g 100 mL/hr over 30 Minutes Intravenous NOW 02/10/19 0027 02/10/19 0243      Subjective:   Cameron West seen and examined today.  Patient not very conversant this morning.  No complaints of chest pain or shortness of breath.  Objective:   Vitals:   02/16/19 1326 02/16/19 2113 02/17/19 0410 02/17/19 0616  BP: (!) 160/95 124/89  133/84  Pulse: 82 75  60  Resp: 18 16  16   Temp: 99.2 F (37.3 C) 98.7 F (37.1 C)  98 F (36.7 C)  TempSrc: Oral Oral  Oral  SpO2: 96% 99%  100%  Weight:   87 kg   Height:        Intake/Output Summary (Last 24 hours) at 02/17/2019 1459 Last data filed at 02/17/2019 0400 Gross per 24 hour  Intake 220 ml  Output -  Net 220 ml   Filed Weights   02/13/19 0408 02/16/19 0345 02/17/19 0410  Weight: 89 kg 88 kg 87 kg    Exam  General: Well developed, well  nourished, NAD, appears stated age  HEENT: NCAT, mucous membranes moist.   Cardiovascular: S1 S2 auscultated, RRR, no murmur.  Respiratory: Clear to auscultation bilaterally   Abdomen: Soft, nontender, nondistended, + bowel sounds  Extremities: warm dry without cyanosis clubbing or edema. Right Cameron dressing place  Neuro: AAOx1, nonfocal  Psych: Flat   Data Reviewed: I have personally reviewed following labs and imaging studies  CBC: Recent Labs  Lab 02/11/19 0243  WBC 5.1  NEUTROABS 2.7  HGB 13.8  HCT 39.9  MCV 87.9  PLT 604   Basic Metabolic Panel: Recent Labs  Lab 02/11/19 0243  NA 140  K 3.3*  CL 106  CO2 26  GLUCOSE 96  BUN 9  CREATININE 0.60*  CALCIUM 9.4  MG 1.9   GFR: Estimated Creatinine Clearance: 112.7 mL/min (A) (by C-G formula based on SCr of 0.6 mg/dL (L)). Liver Function Tests: Recent Labs  Lab 02/11/19 0243  AST 26  ALT 32  ALKPHOS 68  BILITOT 0.5  PROT 6.4*  ALBUMIN 3.7   No results for input(s): LIPASE, AMYLASE in the last 168 hours. No results for input(s): AMMONIA in the last 168 hours. Coagulation Profile: No results for input(s): INR, PROTIME in the last 168 hours. Cardiac Enzymes: No results for input(s): CKTOTAL, CKMB, CKMBINDEX, TROPONINI in the last 168 hours. BNP (last 3 results) No results for input(s): PROBNP in the last 8760 hours. HbA1C: No results for input(s): HGBA1C in the last 72 hours. CBG: No results for input(s): GLUCAP in the last 168 hours. Lipid Profile: No results for input(s): CHOL, HDL, LDLCALC, TRIG, CHOLHDL, LDLDIRECT in the last 72 hours. Thyroid Function Tests: No results for input(s): TSH, T4TOTAL, FREET4, T3FREE, THYROIDAB in the last 72 hours. Anemia Panel: No results for input(s): VITAMINB12, FOLATE, FERRITIN, TIBC, IRON, RETICCTPCT in the last 72 hours. Urine analysis: No results found for: COLORURINE, APPEARANCEUR, LABSPEC, PHURINE, GLUCOSEU, HGBUR, BILIRUBINUR, KETONESUR, PROTEINUR,  UROBILINOGEN, NITRITE, LEUKOCYTESUR Sepsis Labs: @LABRCNTIP (procalcitonin:4,lacticidven:4)  ) Recent Results (from the past 240 hour(s))  SARS Coronavirus 2 by RT PCR (hospital order, performed in [facility] hospital lab) Nasopharyngeal Nasopharyngeal Swab     Status: None   Collection Time: 02/09/19  3:31 PM   Specimen: Nasopharyngeal Swab  Result Value Ref Range Status   SARS Coronavirus 2 NEGATIVE NEGATIVE Final    Comment: (NOTE) If result is NEGATIVE SARS-CoV-2 target nucleic acids are NOT DETECTED. The SARS-CoV-2 RNA is generally detectable in upper and lower  respiratory specimens during the acute phase of infection. The lowest  concentration of SARS-CoV-2 viral copies this assay can detect is 250  copies / mL. A negative result does not preclude SARS-CoV-2 infection  and should not be used as the sole basis for treatment or other  patient management decisions.  A negative result may occur with  improper specimen collection / handling, submission of specimen other  than nasopharyngeal swab, presence of viral mutation(s) within the  areas targeted by this assay, and inadequate number of viral copies  (<250 copies / mL). A negative result  must be combined with clinical  observations, patient history, and epidemiological information. If result is POSITIVE SARS-CoV-2 target nucleic acids are DETECTED. The SARS-CoV-2 RNA is generally detectable in upper and lower  respiratory specimens dur ing the acute phase of infection.  Positive  results are indicative of active infection with SARS-CoV-2.  Clinical  correlation with patient history and other diagnostic information is  necessary to determine patient infection status.  Positive results do  not rule out bacterial infection or co-infection with other viruses. If result is PRESUMPTIVE POSTIVE SARS-CoV-2 nucleic acids MAY BE PRESENT.   A presumptive positive result was obtained on the submitted specimen  and confirmed on repeat  testing.  While 2019 novel coronavirus  (SARS-CoV-2) nucleic acids may be present in the submitted sample  additional confirmatory testing may be necessary for epidemiological  and / or clinical management purposes  to differentiate between  SARS-CoV-2 and other Sarbecovirus currently known to infect humans.  If clinically indicated additional testing with an alternate test  methodology [phone]) is advised. The SARS-CoV-2 RNA is generally  detectable in upper and lower respiratory sp ecimens during the acute  phase of infection. The expected result is Negative. Fact Sheet for Patients:  BoilerBrush.com.cy Fact Sheet for Healthcare Providers: https://pope.com/ This test is not yet approved or cleared by the [place] FDA and has been authorized for detection and/or diagnosis of SARS-CoV-2 by FDA under an Emergency Use Authorization (EUA).  This EUA will remain in effect (meaning this test can be used) for the duration of the COVID-19 declaration under Section 564(b)(1) of the Act, 21 U.S.C. section 360bbb-3(b)(1), unless the authorization is terminated or revoked sooner. Performed at [facility], [address], [place], [place] [address]       Radiology Studies: No results found.   Scheduled Meds: . amLODipine  10 mg Oral Daily  . atorvastatin  40 mg Oral Daily  . divalproex  125 mg Oral BID  . enoxaparin (LOVENOX) injection  40 mg Subcutaneous Q24H  . feeding supplement (ENSURE ENLIVE)  237 mL Oral BID BM  . feeding supplement (PRO-STAT SUGAR FREE 64)  30 mL Oral BID  . memantine  10 mg Oral BID  . multivitamin  1 tablet Oral Daily  . QUEtiapine  100 mg Oral QHS  . QUEtiapine  12.5 mg Oral QAC breakfast  . QUEtiapine  12.5 mg Oral Q1400  . vitamin C  1,000 mg Oral Daily   Continuous Infusions:   LOS: 7 days   Time Spent in minutes   30 minutes  Ishani Goldwasser D.O. on 02/17/2019 at 2:59 PM   Between 7am to 7pm - Please see pager noted on [email or web address]  After 7pm go to [email or web address]  And look for the night coverage person covering for me after hours  [facility] Office  [phone]

## 2019-02-17 NOTE — TOC Progression Note (Signed)
Transition of Care [facility]) - Progression Note    Patient Details  Name: Cameron West MRN: [id number] Date of Birth: 05/18/1955  Transition of Care [facility]) CM/SW [place], LCSW Phone Number: 02/17/2019, 12:41 PM  Clinical Narrative:  _Pt still has no bed offers, CSW will reach out to the SNF's pending.     Expected Discharge Plan: Long Term Nursing Home Barriers to Discharge: Continued Medical Work up  Expected Discharge Plan and Services Expected Discharge Plan: [place]                                               Social Determinants of Health (SDOH) Interventions    Readmission Risk Interventions No flowsheet data found.

## 2019-02-17 NOTE — Progress Notes (Signed)
  Speech Language Pathology Treatment: Dysphagia  Patient Details Name: Cameron West MRN: [id number] DOB: 01-18-1956 Today's Date: 02/17/2019 Time: 7782-4235 SLP Time Calculation (min) (ACUTE ONLY): 17 min  Assessment / Plan / Recommendation Clinical Impression  Skilled treatment session focused on dysphagia. SLP received pt sleeping and he required more than a reasonable amount of time to arouse and Max A multimodal cues. Pt agreeable to consuming some thin water via straw and consumed minimal intake of peanut butter graham cracker. Pt consumed without overt s/s of dysphagia or aspiration. Pt able to open mouth briefly before falling back asleep to ensure that he didn't have any oral residue. Pt appears at reduced risk of aspiration when following general aspiration precautions. ST to sign off. Please re-consult if ST services are indicated.    HPI HPI: Cameron West is a 63 y.o. male with medical history significant of advanced dementia, traumatic brain injury, CAD status post PCI, hypertension, hyperlipidemia presented to the ED for evaluation of hand injury.  Family reported that patient fell while holding a drinking glass and lacerated his hand.  Now s/p surgical repair. CXR 11/4: lungs clear.      SLP Plan  All goals met       Recommendations  Diet recommendations: Regular;Thin liquid Liquids provided via: Cup;Straw Medication Administration: Crushed with puree Supervision: Full supervision/cueing for compensatory strategies;Trained caregiver to feed patient Compensations: Minimize environmental distractions;Slow rate;Small sips/bites Postural Changes and/or Swallow Maneuvers: Seated upright 90 degrees;Upright 30-60 min after meal                Oral Care Recommendations: Oral care BID Follow up Recommendations: None SLP Visit Diagnosis: Dysphagia, unspecified (R13.10) Plan: All goals met       GO                Enrique Weiss 02/17/2019, 3:19 PM

## 2019-02-18 LAB — BASIC METABOLIC PANEL
Anion gap: 9 (ref 5–15)
BUN: 26 mg/dL — ABNORMAL HIGH (ref 8–23)
CO2: 27 mmol/L (ref 22–32)
Calcium: 9.8 mg/dL (ref 8.9–10.3)
Chloride: 107 mmol/L (ref 98–111)
Creatinine, Ser: 0.59 mg/dL — ABNORMAL LOW (ref 0.61–1.24)
GFR calc Af Amer: >60 mL/min (ref >60)
GFR calc non Af Amer: >60 mL/min (ref >60)
Glucose, Bld: 94 mg/dL (ref 70–99)
Potassium: 4.2 mmol/L (ref 3.5–5.1)
Sodium: 143 mmol/L (ref 135–145)

## 2019-02-18 NOTE — Progress Notes (Signed)
Nutrition Follow-up  RD working remotely.   DOCUMENTATION CODES:   Not applicable  INTERVENTION:  - continue Ensure Enlive BID and 30 ml prostat BID. - continue to encourage PO intakes and floor staff to assist with feeding, if needed.    NUTRITION DIAGNOSIS:   Increased nutrient needs related to wound healing(I/D; radial digital nerve and artery repair; complex wound closure) as evidenced by estimated needs. -ongoing  GOAL:   Patient will meet greater than or equal to 90% of their needs -met on average   MONITOR:   PO intake, Labs, I & O's, Supplement acceptance, Skin, Weight trends  ASSESSMENT:   63 year old male with past medical history significant for advanced dementia, TBI, CAD s/p PCI, HTN, HLD who presented to ED for evaluation of right hand laceration after patient fell while holding a drinking glass. Surgery consulted; patient taken to OR and discovered to have radial nerve and artery injury to the index finger as well as significant thenar and abductor muscle injury. Patient underwent irrigation and debridement, radial digital nerve and artery repair with complex wound closure of first webspace of right hand.  Weight stable since admission on 11/3. Patient noted to be a/o to self only; did not attempt to call patient due to level of orientation.   Flow sheet documentation indicates recent intakes: 11/7- 85% of breakfast, 90% of lunch, 100% of dinner 11/9- 75% of breakfast, 100% of dinner 11/11- 100% of breakfast, 0% of dinner  Per review of orders, patient has accepted all bottles of Ensure since 11/9 and has accepted all packets of prostat offered to him.   SLP last assessed patient on 11/11 afternoon and recommended to continue Regular, thin liquids. Palliative Care team talked with patient's son 11/11 afternoon and recommends outpatient community-based palliative services.   Per notes: - R hand laceration--s/p I&D, radial digital nerve repair, artery repair,  complex wound closure - advanced dementia/agitation--Psych following - deconditioning--PT recommending SNF; pending SNF placement - acute urinary retention     Labs reviewed; BUN: 26 mg/dl, creatinine: 0.59 mg/dl. Medications reviewed; 1 tablet prosight/day, 1000 mg oral ascorbic acid/day.     Diet Order:   Diet Order            Diet regular Room service appropriate? No; Fluid consistency: Thin  Diet effective now              EDUCATION NEEDS:   No education needs have been identified at this time  Skin:  Skin Assessment: Reviewed RN Assessment(incision;closed;right;hand)  Last BM:  unknown  Height:   Ht Readings from Last 1 Encounters:  02/13/19 _0  (1.88 m)    Weight:   Wt Readings from Last 1 Encounters:  02/17/19 87 kg    Ideal Body Weight:  80.9 kg  BMI:  Body mass index is 24.63 kg/m.  Estimated Nutritional Needs:   Kcal:  2200-2400 (MSJ 1.1-1.2)  Protein:  110-120  Fluid:  >/= 2.2 L/day     Jarome Matin, MS, RD, LDN, [facility] Inpatient Clinical Dietitian Pager # [phone] After hours/weekend pager # [phone]

## 2019-02-18 NOTE — Progress Notes (Signed)
PROGRESS NOTE    Cameron West  ZOX:[id number] DOB: [birth year] DOA: 02/09/2019 PCP: Lizbeth BarkHairston, Mandesia R, FNP   Brief Narrative:  HPI on 02/09/2019 by Dr. Jolly MangoVasundhra Rathore Cameron West is a 63 y.o. male with medical history significant of advanced dementia, traumatic brain injury, CAD status post PCI, hypertension, hyperlipidemia presented to the ED for evaluation of hand injury.  Family reported that patient fell while holding a drinking glass and lacerated his hand.  X-ray showing no foreign body.  Hand surgery was consulted.  Patient was taken to the OR and discovered to have radial digital nerve and artery injury to the index finger as well as significant thenar and abductor muscle injury.  He underwent irrigation and debridement, radial digital nerve and artery repair, and complex wound closure of the first webspace of the right hand.  Dr. Amanda PeaGramig requested admission for observation under hospitalist service as patient noted to be hypertensive and bladder scan revealed >600 cc urine for which in and out cath was ordered.  Recommended continuing antibiotic for 24 hours.  Patient is currently very somnolent and no history could be obtained from him.  Interim history Noted with laceration of the right hand, status post I&D, radial digital nerve repair, artery repair.  Now pending SNF placement. Assessment & Plan   Laceration of the right hand -Orthopedic/hand surgery consulted and appreciated -Status post irrigation and debridement, radial digital nerve repair, artery repair, complex wound closure of the first webspace of the right hand on 02/09/2019 -Continue wound care per surgery -Antibiotics were discontinued 02/12/2019 but hand surgery -Dr. Amanda PeaGramig removed volar splint on 11/11, recommended dry dressing on the area with clean gauze and Ace wrap.  Can change as often as necessary but would like to wait another 5 to 7 days until stitches are removed.  Hypertensive urgency -Continue amlodipine, BP  now more stable  Advanced dementia/agitation -New Haldol/Ativan as needed for agitation -Palliative care consulted and appreciated to discuss goals of care -Continue memantine -Psychiatry consulted and appreciated, continue Seroquel and Depakote per psychiatry recommendations  Deconditioning -PT recommended SNF placement -Social work consulted  Acute urinary retention -Continue now catheterizations for now  Hyperlipidemia -Continue statin  DVT Prophylaxis  SCDs/ Lovenox  Code Status: DNR  Family Communication: None at bedside  Disposition Plan: Admitted. Pending SNF placement.  Consultants Hand surgery, Dr. Amanda PeaGramig  Procedures  Irrigation and debridement, radial digital nerve repair and artery repair and complex wound closure of the first webspace of the right hand on 02/09/2019  Antibiotics   Anti-infectives (From admission, onward)   Start     Dose/Rate Route Frequency Ordered Stop   02/10/19 0600  ceFAZolin (ANCEF) IVPB 2g/100 mL premix  Status:  Discontinued     2 g 200 mL/hr over 30 Minutes Intravenous On call to O.R. 02/09/19 1955 02/10/19 0028   02/10/19 0600  ceFAZolin (ANCEF) IVPB 1 g/50 mL premix  Status:  Discontinued     1 g 100 mL/hr over 30 Minutes Intravenous Every 8 hours 02/10/19 0144 02/12/19 0753   02/10/19 0030  ceFAZolin (ANCEF) IVPB 1 g/50 mL premix     1 g 100 mL/hr over 30 Minutes Intravenous NOW 02/10/19 0027 02/10/19 0243      Subjective:   Cameron West seen and examined today.  Patient not conversant this morning, no complaints.  Objective:   Vitals:   02/17/19 0410 02/17/19 0616 02/17/19 2119 02/18/19 0545  BP:  133/84 121/72 124/77  Pulse:  60 (!) 53 (!)  52  Resp:  16    Temp:  98 F (36.7 C) 98.5 F (36.9 C) (!) 97.5 F (36.4 C)  TempSrc:  Oral Oral Oral  SpO2:  100% 100% 100%  Weight: 87 kg     Height:        Intake/Output Summary (Last 24 hours) at 02/18/2019 1014 Last data filed at 02/18/2019 0600 Gross per 24 hour   Intake 60 ml  Output 0 ml  Net 60 ml   Filed Weights   02/13/19 0408 02/16/19 0345 02/17/19 0410  Weight: 89 kg 88 kg 87 kg    Exam (no change from 11/11)  General: Well developed, well nourished, NAD, appears stated age  HEENT: NCAT, mucous membranes moist.   Cardiovascular: S1 S2 auscultated, RRR, no murmur.  Respiratory: Clear to auscultation bilaterally   Abdomen: Soft, nontender, nondistended, + bowel sounds  Extremities: warm dry without cyanosis clubbing or edema. Right hand dressing place  Neuro: AAOx1, nonfocal  Psych: Flat   Data Reviewed: I have personally reviewed following labs and imaging studies  CBC: No results for input(s): WBC, NEUTROABS, HGB, HCT, MCV, PLT in the last 168 hours. Basic Metabolic Panel: Recent Labs  Lab 02/18/19 0256  NA 143  K 4.2  CL 107  CO2 27  GLUCOSE 94  BUN 26*  CREATININE 0.59*  CALCIUM 9.8   GFR: Estimated Creatinine Clearance: 112.7 mL/min (A) (by C-G formula based on SCr of 0.59 mg/dL (L)). Liver Function Tests: No results for input(s): AST, ALT, ALKPHOS, BILITOT, PROT, ALBUMIN in the last 168 hours. No results for input(s): LIPASE, AMYLASE in the last 168 hours. No results for input(s): AMMONIA in the last 168 hours. Coagulation Profile: No results for input(s): INR, PROTIME in the last 168 hours. Cardiac Enzymes: No results for input(s): CKTOTAL, CKMB, CKMBINDEX, TROPONINI in the last 168 hours. BNP (last 3 results) No results for input(s): PROBNP in the last 8760 hours. HbA1C: No results for input(s): HGBA1C in the last 72 hours. CBG: No results for input(s): GLUCAP in the last 168 hours. Lipid Profile: No results for input(s): CHOL, HDL, LDLCALC, TRIG, CHOLHDL, LDLDIRECT in the last 72 hours. Thyroid Function Tests: No results for input(s): TSH, T4TOTAL, FREET4, T3FREE, THYROIDAB in the last 72 hours. Anemia Panel: No results for input(s): VITAMINB12, FOLATE, FERRITIN, TIBC, IRON, RETICCTPCT in the  last 72 hours. Urine analysis: No results found for: COLORURINE, APPEARANCEUR, LABSPEC, PHURINE, GLUCOSEU, HGBUR, BILIRUBINUR, KETONESUR, PROTEINUR, UROBILINOGEN, NITRITE, LEUKOCYTESUR Sepsis Labs: @LABRCNTIP (procalcitonin:4,lacticidven:4)  ) Recent Results (from the past 240 hour(s))  SARS Coronavirus 2 by RT PCR (hospital order, performed in [facility] hospital lab) Nasopharyngeal Nasopharyngeal Swab     Status: None   Collection Time: 02/09/19  3:31 PM   Specimen: Nasopharyngeal Swab  Result Value Ref Range Status   SARS Coronavirus 2 NEGATIVE NEGATIVE Final    Comment: (NOTE) If result is NEGATIVE SARS-CoV-2 target nucleic acids are NOT DETECTED. The SARS-CoV-2 RNA is generally detectable in upper and lower  respiratory specimens during the acute phase of infection. The lowest  concentration of SARS-CoV-2 viral copies this assay can detect is 250  copies / mL. A negative result does not preclude SARS-CoV-2 infection  and should not be used as the sole basis for treatment or other  patient management decisions.  A negative result may occur with  improper specimen collection / handling, submission of specimen other  than nasopharyngeal swab, presence of viral mutation(s) within the  areas targeted by this assay,  and inadequate number of viral copies  (<250 copies / mL). A negative result must be combined with clinical  observations, patient history, and epidemiological information. If result is POSITIVE SARS-CoV-2 target nucleic acids are DETECTED. The SARS-CoV-2 RNA is generally detectable in upper and lower  respiratory specimens dur ing the acute phase of infection.  Positive  results are indicative of active infection with SARS-CoV-2.  Clinical  correlation with patient history and other diagnostic information is  necessary to determine patient infection status.  Positive results do  not rule out bacterial infection or co-infection with other viruses. If result is  PRESUMPTIVE POSTIVE SARS-CoV-2 nucleic acids MAY BE PRESENT.   A presumptive positive result was obtained on the submitted specimen  and confirmed on repeat testing.  While 2019 novel coronavirus  (SARS-CoV-2) nucleic acids may be present in the submitted sample  additional confirmatory testing may be necessary for epidemiological  and / or clinical management purposes  to differentiate between  SARS-CoV-2 and other Sarbecovirus currently known to infect humans.  If clinically indicated additional testing with an alternate test  methodology [phone]) is advised. The SARS-CoV-2 RNA is generally  detectable in upper and lower respiratory sp ecimens during the acute  phase of infection. The expected result is Negative. Fact Sheet for Patients:  StrictlyIdeas.no Fact Sheet for Healthcare Providers: BankingDealers.co.za This test is not yet approved or cleared by the [place] FDA and has been authorized for detection and/or diagnosis of SARS-CoV-2 by FDA under an Emergency Use Authorization (EUA).  This EUA will remain in effect (meaning this test can be used) for the duration of the COVID-19 declaration under Section 564(b)(1) of the Act, 21 U.S.C. section 360bbb-3(b)(1), unless the authorization is terminated or revoked sooner. Performed at [facility], [place] [address], [place], [place] [address]       Radiology Studies: No results found.   Scheduled Meds: . amLODipine  10 mg Oral Daily  . atorvastatin  40 mg Oral Daily  . divalproex  125 mg Oral BID  . enoxaparin (LOVENOX) injection  40 mg Subcutaneous Q24H  . feeding supplement (ENSURE ENLIVE)  237 mL Oral BID BM  . feeding supplement (PRO-STAT SUGAR FREE 64)  30 mL Oral BID  . memantine  10 mg Oral BID  . multivitamin  1 tablet Oral Daily  . QUEtiapine  100 mg Oral QHS  . QUEtiapine  12.5 mg Oral QAC breakfast  . QUEtiapine  12.5 mg Oral Q1400  .  vitamin C  1,000 mg Oral Daily   Continuous Infusions:   LOS: 8 days   Time Spent in minutes   30 minutes  Kadija Cruzen D.O. on 02/18/2019 at 10:14 AM  Between 7am to 7pm - Please see pager noted on [email or web address]  After 7pm go to [email or web address]  And look for the night coverage person covering for me after hours  [facility] Office  [phone]

## 2019-02-19 ENCOUNTER — Encounter (HOSPITAL_COMMUNITY): Payer: Self-pay | Admitting: Orthopedic Surgery

## 2019-02-19 NOTE — Progress Notes (Signed)
PROGRESS NOTE    Lyman SpellerLee A Tanton  UJW:[id number] DOB: 06-23-1955 DOA: 02/09/2019 PCP: Lizbeth BarkHairston, Mandesia R, FNP   Brief Narrative:  HPI on 02/09/2019 by Dr. Jolly MangoVasundhra Rathore Kashmere A Espericueta is a 63 y.o. male with medical history significant of advanced dementia, traumatic brain injury, CAD status post PCI, hypertension, hyperlipidemia presented to the ED for evaluation of hand injury.  Family reported that patient fell while holding a drinking glass and lacerated his hand.  X-ray showing no foreign body.  Hand surgery was consulted.  Patient was taken to the OR and discovered to have radial digital nerve and artery injury to the index finger as well as significant thenar and abductor muscle injury.  He underwent irrigation and debridement, radial digital nerve and artery repair, and complex wound closure of the first webspace of the right hand.  Dr. Amanda PeaGramig requested admission for observation under hospitalist service as patient noted to be hypertensive and bladder scan revealed >600 cc urine for which in and out cath was ordered.  Recommended continuing antibiotic for 24 hours.  Patient is currently very somnolent and no history could be obtained from him.  Interim history Noted with laceration of the right hand, status post I&D, radial digital nerve repair, artery repair.  Now pending SNF placement. Assessment & Plan   Laceration of the right hand -Orthopedic/hand surgery consulted and appreciated -Status post irrigation and debridement, radial digital nerve repair, artery repair, complex wound closure of the first webspace of the right hand on 02/09/2019 -Continue wound care per surgery -Antibiotics were discontinued 02/12/2019 but hand surgery -Dr. Amanda PeaGramig removed volar splint on 11/11, recommended dry dressing on the area with clean gauze and Ace wrap.  Can change as often as necessary but would like to wait another 5 to 7 days until stitches are removed.  Hypertensive urgency -Continue amlodipine, BP  controlled  Advanced dementia/agitation -New Haldol/Ativan as needed for agitation -Palliative care consulted and appreciated to discuss goals of care -Continue memantine -Psychiatry consulted and appreciated, continue Seroquel and Depakote per psychiatry recommendations  Deconditioning -PT recommended SNF placement -Social work consulted  Acute urinary retention -Continue now catheterizations for now  Hyperlipidemia -Continue statin  DVT Prophylaxis  SCDs/ Lovenox  Code Status: DNR  Family Communication: None at bedside  Disposition Plan: Admitted. Pending SNF placement.  Consultants Hand surgery, Dr. Amanda PeaGramig  Procedures  Irrigation and debridement, radial digital nerve repair and artery repair and complex wound closure of the first webspace of the right hand on 02/09/2019  Antibiotics   Anti-infectives (From admission, onward)   Start     Dose/Rate Route Frequency Ordered Stop   02/10/19 0600  ceFAZolin (ANCEF) IVPB 2g/100 mL premix  Status:  Discontinued     2 g 200 mL/hr over 30 Minutes Intravenous On call to O.R. 02/09/19 1955 02/10/19 0028   02/10/19 0600  ceFAZolin (ANCEF) IVPB 1 g/50 mL premix  Status:  Discontinued     1 g 100 mL/hr over 30 Minutes Intravenous Every 8 hours 02/10/19 0144 02/12/19 0753   02/10/19 0030  ceFAZolin (ANCEF) IVPB 1 g/50 mL premix     1 g 100 mL/hr over 30 Minutes Intravenous NOW 02/10/19 0027 02/10/19 0243      Subjective:   Irving BurtonLee Yerian seen and examined today.  Patient not very conversant this morning, no new complaints.  Objective:   Vitals:   02/18/19 1500 02/18/19 2300 02/19/19 0501 02/19/19 0757  BP: 125/84 122/88 (!) 141/73 117/80  Pulse: (!) 55 68 63 (!)  54  Resp: 18 20  18   Temp: 97.7 F (36.5 C) 98.1 F (36.7 C) 98.3 F (36.8 C) 98.6 F (37 C)  TempSrc: Axillary Axillary Oral Oral  SpO2: 100% 99% 100% 100%  Weight:      Height:        Intake/Output Summary (Last 24 hours) at 02/19/2019 1058 Last data filed  at 02/19/2019 0400 Gross per 24 hour  Intake 1016 ml  Output 0 ml  Net 1016 ml   Filed Weights   02/13/19 0408 02/16/19 0345 02/17/19 0410  Weight: 89 kg 88 kg 87 kg   Exam  General: Well developed, well nourished, NAD, appears stated age  HEENT: NCAT,  mucous membranes moist.   Extremities: warm dry without cyanosis clubbing or edema.  Right hand dressing in place  Neuro: AAOx1, nonfocal  Psych: Flat  Data Reviewed: I have personally reviewed following labs and imaging studies  CBC: No results for input(s): WBC, NEUTROABS, HGB, HCT, MCV, PLT in the last 168 hours. Basic Metabolic Panel: Recent Labs  Lab 02/18/19 0256  NA 143  K 4.2  CL 107  CO2 27  GLUCOSE 94  BUN 26*  CREATININE 0.59*  CALCIUM 9.8   GFR: Estimated Creatinine Clearance: 112.7 mL/min (A) (by C-G formula based on SCr of 0.59 mg/dL (L)). Liver Function Tests: No results for input(s): AST, ALT, ALKPHOS, BILITOT, PROT, ALBUMIN in the last 168 hours. No results for input(s): LIPASE, AMYLASE in the last 168 hours. No results for input(s): AMMONIA in the last 168 hours. Coagulation Profile: No results for input(s): INR, PROTIME in the last 168 hours. Cardiac Enzymes: No results for input(s): CKTOTAL, CKMB, CKMBINDEX, TROPONINI in the last 168 hours. BNP (last 3 results) No results for input(s): PROBNP in the last 8760 hours. HbA1C: No results for input(s): HGBA1C in the last 72 hours. CBG: No results for input(s): GLUCAP in the last 168 hours. Lipid Profile: No results for input(s): CHOL, HDL, LDLCALC, TRIG, CHOLHDL, LDLDIRECT in the last 72 hours. Thyroid Function Tests: No results for input(s): TSH, T4TOTAL, FREET4, T3FREE, THYROIDAB in the last 72 hours. Anemia Panel: No results for input(s): VITAMINB12, FOLATE, FERRITIN, TIBC, IRON, RETICCTPCT in the last 72 hours. Urine analysis: No results found for: COLORURINE, APPEARANCEUR, LABSPEC, PHURINE, GLUCOSEU, HGBUR, BILIRUBINUR, KETONESUR,  PROTEINUR, UROBILINOGEN, NITRITE, LEUKOCYTESUR Sepsis Labs: @LABRCNTIP (procalcitonin:4,lacticidven:4)  ) Recent Results (from the past 240 hour(s))  SARS Coronavirus 2 by RT PCR (hospital order, performed in [facility] hospital lab) Nasopharyngeal Nasopharyngeal Swab     Status: None   Collection Time: 02/09/19  3:31 PM   Specimen: Nasopharyngeal Swab  Result Value Ref Range Status   SARS Coronavirus 2 NEGATIVE NEGATIVE Final    Comment: (NOTE) If result is NEGATIVE SARS-CoV-2 target nucleic acids are NOT DETECTED. The SARS-CoV-2 RNA is generally detectable in upper and lower  respiratory specimens during the acute phase of infection. The lowest  concentration of SARS-CoV-2 viral copies this assay can detect is 250  copies / mL. A negative result does not preclude SARS-CoV-2 infection  and should not be used as the sole basis for treatment or other  patient management decisions.  A negative result may occur with  improper specimen collection / handling, submission of specimen other  than nasopharyngeal swab, presence of viral mutation(s) within the  areas targeted by this assay, and inadequate number of viral copies  (<250 copies / mL). A negative result must be combined with clinical  observations, patient history, and epidemiological  information. If result is POSITIVE SARS-CoV-2 target nucleic acids are DETECTED. The SARS-CoV-2 RNA is generally detectable in upper and lower  respiratory specimens dur ing the acute phase of infection.  Positive  results are indicative of active infection with SARS-CoV-2.  Clinical  correlation with patient history and other diagnostic information is  necessary to determine patient infection status.  Positive results do  not rule out bacterial infection or co-infection with other viruses. If result is PRESUMPTIVE POSTIVE SARS-CoV-2 nucleic acids MAY BE PRESENT.   A presumptive positive result was obtained on the submitted specimen  and confirmed  on repeat testing.  While 2019 novel coronavirus  (SARS-CoV-2) nucleic acids may be present in the submitted sample  additional confirmatory testing may be necessary for epidemiological  and / or clinical management purposes  to differentiate between  SARS-CoV-2 and other Sarbecovirus currently known to infect humans.  If clinically indicated additional testing with an alternate test  methodology [phone]) is advised. The SARS-CoV-2 RNA is generally  detectable in upper and lower respiratory sp ecimens during the acute  phase of infection. The expected result is Negative. Fact Sheet for Patients:  BoilerBrush.com.cy Fact Sheet for Healthcare Providers: https://pope.com/ This test is not yet approved or cleared by the [place] FDA and has been authorized for detection and/or diagnosis of SARS-CoV-2 by FDA under an Emergency Use Authorization (EUA).  This EUA will remain in effect (meaning this test can be used) for the duration of the COVID-19 declaration under Section 564(b)(1) of the Act, 21 U.S.C. section 360bbb-3(b)(1), unless the authorization is terminated or revoked sooner. Performed at [facility], [address], [place], [place] [address]       Radiology Studies: No results found.   Scheduled Meds: . amLODipine  10 mg Oral Daily  . atorvastatin  40 mg Oral Daily  . divalproex  125 mg Oral BID  . enoxaparin (LOVENOX) injection  40 mg Subcutaneous Q24H  . feeding supplement (ENSURE ENLIVE)  237 mL Oral BID BM  . feeding supplement (PRO-STAT SUGAR FREE 64)  30 mL Oral BID  . memantine  10 mg Oral BID  . multivitamin  1 tablet Oral Daily  . QUEtiapine  100 mg Oral QHS  . QUEtiapine  12.5 mg Oral QAC breakfast  . QUEtiapine  12.5 mg Oral Q1400  . vitamin C  1,000 mg Oral Daily   Continuous Infusions:   LOS: 9 days   Time Spent in minutes   20 minutes  Cezar Misiaszek D.O. on 02/19/2019 at  10:58 AM  Between 7am to 7pm - Please see pager noted on [email or web address]  After 7pm go to [email or web address]  And look for the night coverage person covering for me after hours  [facility] Office  [phone]

## 2019-02-20 NOTE — Progress Notes (Signed)
PROGRESS NOTE    Cameron West  GBT:[id number] DOB: 02-Apr-1956 DOA: 02/09/2019 PCP: Alfonse Spruce, FNP   Brief Narrative:  HPI on 02/09/2019 by Dr. Owens Shark is a 63 y.o. male with medical history significant of advanced dementia, traumatic brain injury, CAD status post PCI, hypertension, hyperlipidemia presented to the ED for evaluation of hand injury.  Family reported that patient fell while holding a drinking glass and lacerated his hand.  X-ray showing no foreign body.  Hand surgery was consulted.  Patient was taken to the OR and discovered to have radial digital nerve and artery injury to the index finger as well as significant thenar and abductor muscle injury.  He underwent irrigation and debridement, radial digital nerve and artery repair, and complex wound closure of the first webspace of the right hand.  Dr. Amedeo Plenty requested admission for observation under hospitalist service as patient noted to be hypertensive and bladder scan revealed >600 cc urine for which in and out cath was ordered.  Recommended continuing antibiotic for 24 hours.  Patient is currently very somnolent and no history could be obtained from him.  Interim history Noted with laceration of the right hand, status post I&D, radial digital nerve repair, artery repair.  Now pending SNF placement. Assessment & Plan   Laceration of the right hand -Orthopedic/hand surgery consulted and appreciated -Status post irrigation and debridement, radial digital nerve repair, artery repair, complex wound closure of the first webspace of the right hand on 02/09/2019 -Continue wound care per surgery -Antibiotics were discontinued 02/12/2019 but hand surgery -Dr. Amedeo Plenty removed volar splint on 11/11, recommended dry dressing on the area with clean gauze and Ace wrap.  Can change as often as necessary but would like to wait another 5 to 7 days until stitches are removed.  Hypertensive urgency -Continue amlodipine, BP  controlled  Advanced dementia/agitation -Continue Haldol/Ativan as needed for agitation -Palliative care consulted and appreciated to discuss goals of care -Continue memantine -Psychiatry consulted and appreciated, continue Seroquel and Depakote per psychiatry recommendations  Deconditioning -PT recommended SNF placement -Social work consulted  Acute urinary retention -Continue now catheterizations for now  Hyperlipidemia -Continue statin  DVT Prophylaxis  SCDs/ Lovenox  Code Status: DNR  Family Communication: None at bedside  Disposition Plan: Admitted. Pending SNF placement.  Consultants Hand surgery, Dr. Amedeo Plenty  Procedures  Irrigation and debridement, radial digital nerve repair and artery repair and complex wound closure of the first webspace of the right hand on 02/09/2019  Antibiotics   Anti-infectives (From admission, onward)   Start     Dose/Rate Route Frequency Ordered Stop   02/10/19 0600  ceFAZolin (ANCEF) IVPB 2g/100 mL premix  Status:  Discontinued     2 g 200 mL/hr over 30 Minutes Intravenous On call to O.R. 02/09/19 1955 02/10/19 0028   02/10/19 0600  ceFAZolin (ANCEF) IVPB 1 g/50 mL premix  Status:  Discontinued     1 g 100 mL/hr over 30 Minutes Intravenous Every 8 hours 02/10/19 0144 02/12/19 0753   02/10/19 0030  ceFAZolin (ANCEF) IVPB 1 g/50 mL premix     1 g 100 mL/hr over 30 Minutes Intravenous NOW 02/10/19 0027 02/10/19 0243      Subjective:   Cameron West seen and examined today.  Patient not very conversant today. No complaints today.  Objective:   Vitals:   02/19/19 2012 02/20/19 0425 02/20/19 0700 02/20/19 0936  BP: 120/86 (!) 121/93  118/89  Pulse: 70 76  Resp: 17 20    Temp: 99.1 F (37.3 C) 98 F (36.7 C)    TempSrc: Oral Axillary    SpO2: 99% 100%    Weight:   84 kg   Height:        Intake/Output Summary (Last 24 hours) at 02/20/2019 0959 Last data filed at 02/19/2019 1900 Gross per 24 hour  Intake 720 ml  Output -   Net 720 ml   Filed Weights   02/16/19 0345 02/17/19 0410 02/20/19 0700  Weight: 88 kg 87 kg 84 kg   Exam  General: Well developed, well nourished, NAD, appears stated age  HEENT: NCAT, mucous membranes moist.   Extremities: warm dry without cyanosis clubbing or edema. Right hand dressing in place  Neuro: AAOx1, nonfocal  Psych: Flat  Data Reviewed: I have personally reviewed following labs and imaging studies  CBC: No results for input(s): WBC, NEUTROABS, HGB, HCT, MCV, PLT in the last 168 hours. Basic Metabolic Panel: Recent Labs  Lab 02/18/19 0256  NA 143  K 4.2  CL 107  CO2 27  GLUCOSE 94  BUN 26*  CREATININE 0.59*  CALCIUM 9.8   GFR: Estimated Creatinine Clearance: 112.7 mL/min (A) (by C-G formula based on SCr of 0.59 mg/dL (L)). Liver Function Tests: No results for input(s): AST, ALT, ALKPHOS, BILITOT, PROT, ALBUMIN in the last 168 hours. No results for input(s): LIPASE, AMYLASE in the last 168 hours. No results for input(s): AMMONIA in the last 168 hours. Coagulation Profile: No results for input(s): INR, PROTIME in the last 168 hours. Cardiac Enzymes: No results for input(s): CKTOTAL, CKMB, CKMBINDEX, TROPONINI in the last 168 hours. BNP (last 3 results) No results for input(s): PROBNP in the last 8760 hours. HbA1C: No results for input(s): HGBA1C in the last 72 hours. CBG: No results for input(s): GLUCAP in the last 168 hours. Lipid Profile: No results for input(s): CHOL, HDL, LDLCALC, TRIG, CHOLHDL, LDLDIRECT in the last 72 hours. Thyroid Function Tests: No results for input(s): TSH, T4TOTAL, FREET4, T3FREE, THYROIDAB in the last 72 hours. Anemia Panel: No results for input(s): VITAMINB12, FOLATE, FERRITIN, TIBC, IRON, RETICCTPCT in the last 72 hours. Urine analysis: No results found for: COLORURINE, APPEARANCEUR, LABSPEC, PHURINE, GLUCOSEU, HGBUR, BILIRUBINUR, KETONESUR, PROTEINUR, UROBILINOGEN, NITRITE, LEUKOCYTESUR Sepsis Labs:  @LABRCNTIP (procalcitonin:4,lacticidven:4)  ) No results found for this or any previous visit (from the past 240 hour(s)).    Radiology Studies: No results found.   Scheduled Meds: . amLODipine  10 mg Oral Daily  . atorvastatin  40 mg Oral Daily  . divalproex  125 mg Oral BID  . enoxaparin (LOVENOX) injection  40 mg Subcutaneous Q24H  . feeding supplement (ENSURE ENLIVE)  237 mL Oral BID BM  . feeding supplement (PRO-STAT SUGAR FREE 64)  30 mL Oral BID  . memantine  10 mg Oral BID  . multivitamin  1 tablet Oral Daily  . QUEtiapine  100 mg Oral QHS  . QUEtiapine  12.5 mg Oral QAC breakfast  . QUEtiapine  12.5 mg Oral Q1400  . vitamin C  1,000 mg Oral Daily   Continuous Infusions:   LOS: 10 days   Time Spent in minutes   20 minutes  Tatelyn Vanhecke D.O. on 02/20/2019 at 9:59 AM  Between 7am to 7pm - Please see pager noted on [email or web address]  After 7pm go to [email or web address]  And look for the night coverage person covering for me after hours  [facility] Office  [phone]

## 2019-02-21 NOTE — Progress Notes (Signed)
PROGRESS NOTE    Cameron West  QBV:[id number] DOB: April 25, 1955 DOA: 02/09/2019 PCP: Lizbeth Bark, FNP   Brief Narrative:  HPI on 02/09/2019 by Dr. Jolly Mango is a 63 y.o. male with medical history significant of advanced dementia, traumatic brain injury, CAD status post PCI, hypertension, hyperlipidemia presented to the ED for evaluation of hand injury.  Family reported that patient fell while holding a drinking glass and lacerated his hand.  X-ray showing no foreign body.  Hand surgery was consulted.  Patient was taken to the OR and discovered to have radial digital nerve and artery injury to the index finger as well as significant thenar and abductor muscle injury.  He underwent irrigation and debridement, radial digital nerve and artery repair, and complex wound closure of the first webspace of the right hand.  Dr. Amanda Pea requested admission for observation under hospitalist service as patient noted to be hypertensive and bladder scan revealed >600 cc urine for which in and out cath was ordered.  Recommended continuing antibiotic for 24 hours.  Patient is currently very somnolent and no history could be obtained from him.  Interim history Noted with laceration of the right hand, status post I&D, radial digital nerve repair, artery repair.  Now pending SNF placement. Assessment & Plan   Laceration of the right hand -Orthopedic/hand surgery consulted and appreciated -Status post irrigation and debridement, radial digital nerve repair, artery repair, complex wound closure of the first webspace of the right hand on 02/09/2019 -Continue wound care per surgery -Antibiotics were discontinued 02/12/2019 but hand surgery -Dr. Amanda Pea removed volar splint on 11/11, recommended dry dressing on the area with clean gauze and Ace wrap.  Can change as often as necessary but would like to wait another 5 to 7 days until stitches are removed.  Hypertensive urgency -Continue amlodipine, BP  controlled  Advanced dementia/agitation -Continue Haldol/Ativan as needed for agitation -Palliative care consulted and appreciated to discuss goals of care -Continue memantine -Psychiatry consulted and appreciated, continue Seroquel and Depakote per psychiatry recommendations  Deconditioning -PT recommended SNF placement -Social work consulted  Acute urinary retention -Continue now catheterizations for now  Hyperlipidemia -Continue statin  DVT Prophylaxis  SCDs/ Lovenox  Code Status: DNR  Family Communication: None at bedside  Disposition Plan: Admitted. Pending SNF placement. Patient still requiring assistance and constant instruction for daily activities.   Consultants Hand surgery, Dr. Amanda Pea  Procedures  Irrigation and debridement, radial digital nerve repair and artery repair and complex wound closure of the first webspace of the right hand on 02/09/2019  Antibiotics   Anti-infectives (From admission, onward)   Start     Dose/Rate Route Frequency Ordered Stop   02/10/19 0600  ceFAZolin (ANCEF) IVPB 2g/100 mL premix  Status:  Discontinued     2 g 200 mL/hr over 30 Minutes Intravenous On call to O.R. 02/09/19 1955 02/10/19 0028   02/10/19 0600  ceFAZolin (ANCEF) IVPB 1 g/50 mL premix  Status:  Discontinued     1 g 100 mL/hr over 30 Minutes Intravenous Every 8 hours 02/10/19 0144 02/12/19 0753   02/10/19 0030  ceFAZolin (ANCEF) IVPB 1 g/50 mL premix     1 g 100 mL/hr over 30 Minutes Intravenous NOW 02/10/19 0027 02/10/19 0243      Subjective:   Cameron West seen and examined today.  Patient not very conversant today. No complaints today.  Objective:   Vitals:   02/20/19 1500 02/20/19 2125 02/21/19 0538 02/21/19 0921  BP: 121/88  121/79 119/65 126/84  Pulse: 77 (!) 58 (!) 57   Resp: 20 15    Temp: 98.1 F (36.7 C) (!) 97.4 F (36.3 C) 98.3 F (36.8 C)   TempSrc: Axillary Oral Oral   SpO2: 100% 100% 100%   Weight:      Height:        Intake/Output  Summary (Last 24 hours) at 02/21/2019 1045 Last data filed at 02/21/2019 1035 Gross per 24 hour  Intake 840 ml  Output -  Net 840 ml   Filed Weights   02/16/19 0345 02/17/19 0410 02/20/19 0700  Weight: 88 kg 87 kg 84 kg   Exam  General: Well developed, well nourished, NAD, appears stated age  HEENT: NCAT, mucous membranes moist.   Extremities: warm dry without cyanosis clubbing or edema. R hand dressing in palce  Neuro: AAOx1 (self), nonfocal  Psych: Flat   Data Reviewed: I have personally reviewed following labs and imaging studies  CBC: No results for input(s): WBC, NEUTROABS, HGB, HCT, MCV, PLT in the last 168 hours. Basic Metabolic Panel: Recent Labs  Lab 02/18/19 0256  NA 143  K 4.2  CL 107  CO2 27  GLUCOSE 94  BUN 26*  CREATININE 0.59*  CALCIUM 9.8   GFR: Estimated Creatinine Clearance: 112.7 mL/min (A) (by C-G formula based on SCr of 0.59 mg/dL (L)). Liver Function Tests: No results for input(s): AST, ALT, ALKPHOS, BILITOT, PROT, ALBUMIN in the last 168 hours. No results for input(s): LIPASE, AMYLASE in the last 168 hours. No results for input(s): AMMONIA in the last 168 hours. Coagulation Profile: No results for input(s): INR, PROTIME in the last 168 hours. Cardiac Enzymes: No results for input(s): CKTOTAL, CKMB, CKMBINDEX, TROPONINI in the last 168 hours. BNP (last 3 results) No results for input(s): PROBNP in the last 8760 hours. HbA1C: No results for input(s): HGBA1C in the last 72 hours. CBG: No results for input(s): GLUCAP in the last 168 hours. Lipid Profile: No results for input(s): CHOL, HDL, LDLCALC, TRIG, CHOLHDL, LDLDIRECT in the last 72 hours. Thyroid Function Tests: No results for input(s): TSH, T4TOTAL, FREET4, T3FREE, THYROIDAB in the last 72 hours. Anemia Panel: No results for input(s): VITAMINB12, FOLATE, FERRITIN, TIBC, IRON, RETICCTPCT in the last 72 hours. Urine analysis: No results found for: COLORURINE, APPEARANCEUR,  LABSPEC, PHURINE, GLUCOSEU, HGBUR, BILIRUBINUR, KETONESUR, PROTEINUR, UROBILINOGEN, NITRITE, LEUKOCYTESUR Sepsis Labs: @LABRCNTIP (procalcitonin:4,lacticidven:4)  ) No results found for this or any previous visit (from the past 240 hour(s)).    Radiology Studies: No results found.   Scheduled Meds: . amLODipine  10 mg Oral Daily  . atorvastatin  40 mg Oral Daily  . divalproex  125 mg Oral BID  . enoxaparin (LOVENOX) injection  40 mg Subcutaneous Q24H  . feeding supplement (ENSURE ENLIVE)  237 mL Oral BID BM  . feeding supplement (PRO-STAT SUGAR FREE 64)  30 mL Oral BID  . memantine  10 mg Oral BID  . multivitamin  1 tablet Oral Daily  . QUEtiapine  100 mg Oral QHS  . QUEtiapine  12.5 mg Oral QAC breakfast  . QUEtiapine  12.5 mg Oral Q1400  . vitamin C  1,000 mg Oral Daily   Continuous Infusions:   LOS: 11 days   Time Spent in minutes   20 minutes  Annaelle Kasel D.O. on 02/21/2019 at 10:45 AM  Between 7am to 7pm - Please see pager noted on [email or web address]  After 7pm go to [email or web address]  And look for the night coverage person  covering for me after hours  [facility] Office  [phone]

## 2019-02-21 NOTE — Progress Notes (Signed)
Occupational Therapy Treatment Patient Details Name: Cameron West MRN: [id number] DOB: [birth year] Today's Date: 02/21/2019    History of present illness  63 y.o. male with medical history significant of advanced dementia, traumatic brain injury, CAD status post PCI, hypertension, hyperlipidemia presented to the ED for evaluation of hand injury. S/p irrigation debridement and digital nerve and artery repair with irrigation and debridement right hand.    OT comments  Pt received in bed eating, awake and alert, agreeable to session, most likely agreeable to do anything due to cognitive dysfunction. Pt requires several cues and commands to engage in grooming at EOB, as well as transitioning to sit at EOB. Supervision to Min A with functional transfer to sink for ADL for safety and cueing for attention towards seated surface. Continue POC to address established deficits.    Follow Up Recommendations  SNF;Supervision/Assistance - 24 hour    Equipment Recommendations  Other (comment)(defer to next venue)    Recommendations for Other Services      Precautions / Restrictions Precautions Precautions: Fall Restrictions Weight Bearing Restrictions: Yes RUE Weight Bearing: Weight bear through elbow only Other Position/Activity Restrictions: Verbal order per Amedeo Plenty, MD       Mobility Bed Mobility Overal bed mobility: Needs Assistance Bed Mobility: Supine to Sit     Supine to sit: Supervision Sit to supine: Min guard   General bed mobility comments: Basic commands to move to edge of bed.  Transfers Overall transfer level: Needs assistance Equipment used: None;1 person hand held assist Transfers: Sit to/from Stand Sit to Stand: Min assist;Supervision         General transfer comment: required various levels of assistance due to dementia. From bed required min assistance to engage in activity.  Pt performed transfer from bed with close supervision.    Balance Overall balance  assessment: Needs assistance Sitting-balance support: No upper extremity supported;Feet supported Sitting balance-Leahy Scale: Fair     Standing balance support: Single extremity supported Standing balance-Leahy Scale: Poor Standing balance comment: hand held assistance for safety                           ADL either performed or assessed with clinical judgement   ADL Overall ADL's : Needs assistance/impaired       Grooming Details (indicate cue type and reason): Cueing to initiate. Pt given order of task, ie washing then brushing teeth, pt unable to state order or answer command correctly. Pt required cueing for tasks, asked to rinse mouth with water to spit, pt drunk water instead.                 Toilet Transfer: Minimal assistance;Ambulation;Regular Toilet;Cueing for safety Toilet Transfer Details (indicate cue type and reason): simulated toilet transfer from bed to chair at sink for bathing with CNA         Functional mobility during ADLs: Minimal assistance;Cueing for safety;Cueing for sequencing       Vision       Perception     Praxis      Cognition Arousal/Alertness: Awake/alert Behavior During Therapy: WFL for tasks assessed/performed Overall Cognitive Status: History of cognitive impairments - at baseline                                 General Comments: baseline TBI and dementia. patient tangential throughout session        Exercises  Shoulder Instructions       General Comments      Pertinent Vitals/ Pain       Pain Assessment: No/denies pain Pain Location: movement of right elbow Pain Descriptors / Indicators: Guarding;Grimacing;Discomfort Pain Intervention(s): Monitored during session;Repositioned  Home Living                                          Prior Functioning/Environment              Frequency  Min 2X/week        Progress Toward Goals  OT Goals(current goals can  now be found in the care plan section)  Progress towards OT goals: Progressing toward goals  Acute Rehab OT Goals Patient Stated Goal: did not state OT Goal Formulation: Patient unable to participate in goal setting Time For Goal Achievement: 02/24/19 Potential to Achieve Goals: Good ADL Goals Pt Will Perform Grooming: with min guard assist;standing Pt Will Transfer to Toilet: with min guard assist;bedside commode Additional ADL Goal #1: Pt will follow simple, one step cues 50% of time for ADLs  Plan Discharge plan remains appropriate    Co-evaluation                 AM-PAC OT "6 Clicks" Daily Activity     Outcome Measure   Help from another person eating meals?: A Little Help from another person taking care of personal grooming?: A Little Help from another person toileting, which includes using toliet, bedpan, or urinal?: A Little Help from another person bathing (including washing, rinsing, drying)?: A Little Help from another person to put on and taking off regular upper body clothing?: A Little Help from another person to put on and taking off regular lower body clothing?: A Little 6 Click Score: 18    End of Session Equipment Utilized During Treatment: Gait belt  OT Visit Diagnosis: Unsteadiness on feet (R26.81);Other abnormalities of gait and mobility (R26.89);Muscle weakness (generalized) (M62.81);Other symptoms and signs involving cognitive function   Activity Tolerance Patient tolerated treatment well   Patient Left in chair;with nursing/sitter in room   Nurse Communication Mobility status        Time: 0941-1000 OT Time Calculation (min): 19 min  Charges: OT General Charges $OT Visit: 1 Visit OT Treatments $Self Care/Home Management : 8-22 mins  Marquette Old, MSOT, OTR/L  [facility]  [phone]    Zigmund Daniel 02/21/2019, 10:20 AM

## 2019-02-22 NOTE — Progress Notes (Signed)
Physical Therapy Treatment Patient Details Name: Cameron West MRN: [id number] DOB: 01-27-1956 Today's Date: 02/22/2019    History of Present Illness  63 y.o. male with medical history significant of advanced dementia, traumatic brain injury, CAD status post PCI, hypertension, hyperlipidemia presented to the ED for evaluation of hand injury. S/p irrigation debridement and digital nerve and artery repair with irrigation and debridement right hand.     PT Comments    Pt performed gt training with decreased assistance and improved balance.  His cognition remains impaired but this appears to be his baseline given his TBI history.  He will require snf placement at d/c as he is not safe to function without 24 hr assistance.    Follow Up Recommendations  SNF;Supervision/Assistance - 24 hour     Equipment Recommendations  None recommended by PT    Recommendations for Other Services       Precautions / Restrictions Precautions Precautions: Fall Restrictions Weight Bearing Restrictions: Yes RUE Weight Bearing: Weight bear through elbow only Other Position/Activity Restrictions: Verbal order per Amedeo Plenty, MD    Mobility  Bed Mobility Overal bed mobility: Needs Assistance Bed Mobility: Sit to Supine       Sit to supine: Supervision   General bed mobility comments: Basic commands to move back to bed.  Transfers Overall transfer level: Needs assistance Equipment used: None Transfers: Sit to/from Stand Sit to Stand: Supervision         General transfer comment: Basic commands to stand. NO LOB and gt belt donned in standing.  Ambulation/Gait Ambulation/Gait assistance: Supervision;Min guard Gait Distance (Feet): 300 Feet Assistive device: None Gait Pattern/deviations: Step-through pattern;Drifts right/left;Decreased stride length;Trunk flexed Gait velocity: decreased   General Gait Details: Pt with no ability to [employer].  Intermittent use of railing noted.  Pt  required cues for reciprocal armswing.   Stairs             Wheelchair Mobility    Modified Rankin (Stroke Patients Only)       Balance Overall balance assessment: Needs assistance   Sitting balance-Leahy Scale: Fair       Standing balance-Leahy Scale: Poor Standing balance comment: hand held assistance for safety                            Cognition Arousal/Alertness: Awake/alert Behavior During Therapy: WFL for tasks assessed/performed Overall Cognitive Status: History of cognitive impairments - at baseline                                 General Comments: baseline TBI and dementia. patient tangential throughout session, unable to locate room despite being given room #.      Exercises      General Comments        Pertinent Vitals/Pain Pain Assessment: No/denies pain Faces Pain Scale: Hurts little more Pain Location: movement of right elbow Pain Descriptors / Indicators: Guarding;Grimacing;Discomfort Pain Intervention(s): Monitored during session;Repositioned    Home Living                      Prior Function            PT Goals (current goals can now be found in the care plan section) Acute Rehab PT Goals Patient Stated Goal: did not state Potential to Achieve Goals: Fair Progress towards PT goals: Progressing toward goals    Frequency  Min 2X/week      PT Plan Current plan remains appropriate    Co-evaluation              AM-PAC PT "6 Clicks" Mobility   Outcome Measure  Help needed turning from your back to your side while in a flat bed without using bedrails?: None Help needed moving from lying on your back to sitting on the side of a flat bed without using bedrails?: None Help needed moving to and from a bed to a chair (including a wheelchair)?: A Little Help needed standing up from a chair using your arms (e.g., wheelchair or bedside chair)?: A Little Help needed to walk in hospital  room?: A Little Help needed climbing 3-5 steps with a railing? : A Little 6 Click Score: 20    End of Session Equipment Utilized During Treatment: Gait belt Activity Tolerance: Patient tolerated treatment well Patient left: in bed;with call bell/phone within reach;with bed alarm set Nurse Communication: Mobility status PT Visit Diagnosis: Unsteadiness on feet (R26.81)     Time: 3818-2993 PT Time Calculation (min) (ACUTE ONLY): 19 min  Charges:  $Gait Training: 8-22 mins                     Bonney Leitz , PTA Acute Rehabilitation Services Pager [phone] Office [phone]     Cameron West 02/22/2019, 5:33 PM

## 2019-02-22 NOTE — TOC Progression Note (Signed)
Transition of Care [facility]) - Progression Note    Patient Details  Name: Cameron West MRN: [id number] Date of Birth: [birth year]  Transition of Care [facility]) CM/SW [place], [place] Phone Number: 02/22/2019, 2:35 PM  Clinical Narrative:     Patient currently does not have any bed offers. Patient remains difficult to place at this time.   CSW has faxed him out to additional facilities. CSW will continue to follow and assist with discharge planning.   Expected Discharge Plan: Long Term Nursing Home Barriers to Discharge: Continued Medical Work up  Expected Discharge Plan and Services Expected Discharge Plan: [place]                                               Social Determinants of Health (SDOH) Interventions    Readmission Risk Interventions No flowsheet data found.

## 2019-02-22 NOTE — Progress Notes (Signed)
PROGRESS NOTE    Cameron West  INO:[id number] DOB: November 25, 1955 DOA: 02/09/2019 PCP: Alfonse Spruce, FNP   Brief Narrative:  HPI on 02/09/2019 by Dr. Owens Shark is a 62 y.o. male with medical history significant of advanced dementia, traumatic brain injury, CAD status post PCI, hypertension, hyperlipidemia presented to the ED for evaluation of hand injury.  Family reported that patient fell while holding a drinking glass and lacerated his hand.  X-ray showing no foreign body.  Hand surgery was consulted.  Patient was taken to the OR and discovered to have radial digital nerve and artery injury to the index finger as well as significant thenar and abductor muscle injury.  He underwent irrigation and debridement, radial digital nerve and artery repair, and complex wound closure of the first webspace of the right hand.  Dr. Amedeo Plenty requested admission for observation under hospitalist service as patient noted to be hypertensive and bladder scan revealed >600 cc urine for which in and out cath was ordered.  Recommended continuing antibiotic for 24 hours.  Patient is currently very somnolent and no history could be obtained from him.  Interim history Noted with laceration of the right hand, status post I&D, radial digital nerve repair, artery repair.  Now pending SNF placement. Assessment & Plan   Laceration of the right hand -Orthopedic/hand surgery consulted and appreciated -Status post irrigation and debridement, radial digital nerve repair, artery repair, complex wound closure of the first webspace of the right hand on 02/09/2019 -Continue wound care per surgery -Antibiotics were discontinued 02/12/2019 but hand surgery -Dr. Amedeo Plenty removed volar splint on 11/11, recommended dry dressing on the area with clean gauze and Ace wrap.  Can change as often as necessary but would like to wait another 5 to 7 days until stitches are removed.  Hypertensive urgency -Continue amlodipine, BP  controlled  Advanced dementia/agitation -Continue Haldol/Ativan as needed for agitation -Palliative care consulted and appreciated to discuss goals of care -Continue memantine -Psychiatry consulted and appreciated, continue Seroquel and Depakote per psychiatry recommendations  Deconditioning -PT recommended SNF placement -Social work consulted  Acute urinary retention -Continue now catheterizations for now  Hyperlipidemia -Continue statin  DVT Prophylaxis  SCDs/ Lovenox  Code Status: DNR  Family Communication: None at bedside  Disposition Plan: Admitted. Pending SNF placement. Patient still requiring assistance and constant instruction for daily activities.   Consultants Hand surgery, Dr. Amedeo Plenty  Procedures  Irrigation and debridement, radial digital nerve repair and artery repair and complex wound closure of the first webspace of the right hand on 02/09/2019  Antibiotics   Anti-infectives (From admission, onward)   Start     Dose/Rate Route Frequency Ordered Stop   02/10/19 0600  ceFAZolin (ANCEF) IVPB 2g/100 mL premix  Status:  Discontinued     2 g 200 mL/hr over 30 Minutes Intravenous On call to O.R. 02/09/19 1955 02/10/19 0028   02/10/19 0600  ceFAZolin (ANCEF) IVPB 1 g/50 mL premix  Status:  Discontinued     1 g 100 mL/hr over 30 Minutes Intravenous Every 8 hours 02/10/19 0144 02/12/19 0753   02/10/19 0030  ceFAZolin (ANCEF) IVPB 1 g/50 mL premix     1 g 100 mL/hr over 30 Minutes Intravenous NOW 02/10/19 0027 02/10/19 0243      Subjective:   Cameron West seen and examined today.  Patient not conversant, just stares at me.  No complaints today, just shakes his head at questions. Objective:   Vitals:   02/21/19 0538 02/21/19  1300 02/21/19 2119 02/22/19 0608  BP: 119/65 126/84 111/83 110/73  Pulse: (!) 57 66 (!) 59 (!) 49  Resp:  19 16 14   Temp: 98.3 F (36.8 C) 98.5 F (36.9 C) 97.8 F (36.6 C) 98.1 F (36.7 C)  TempSrc: Oral Oral Oral Oral  SpO2: 100%   100% 99%  Weight:      Height:        Intake/Output Summary (Last 24 hours) at 02/22/2019 1131 Last data filed at 02/21/2019 1500 Gross per 24 hour  Intake 480 ml  Output -  Net 480 ml   Filed Weights   02/16/19 0345 02/17/19 0410 02/20/19 0700  Weight: 88 kg 87 kg 84 kg   Exam  General: Well developed, chronically ill-appearing, NAD  HEENT: NCAT,  mucous membranes moist.   Extremities: warm dry without cyanosis clubbing or edema.  Right hand dressing in place  Neuro: awake and alert, nonfocal  Psych: Flat  Data Reviewed: I have personally reviewed following labs and imaging studies  CBC: No results for input(s): WBC, NEUTROABS, HGB, HCT, MCV, PLT in the last 168 hours. Basic Metabolic Panel: Recent Labs  Lab 02/18/19 0256  NA 143  K 4.2  CL 107  CO2 27  GLUCOSE 94  BUN 26*  CREATININE 0.59*  CALCIUM 9.8   GFR: Estimated Creatinine Clearance: 112.7 mL/min (A) (by C-G formula based on SCr of 0.59 mg/dL (L)). Liver Function Tests: No results for input(s): AST, ALT, ALKPHOS, BILITOT, PROT, ALBUMIN in the last 168 hours. No results for input(s): LIPASE, AMYLASE in the last 168 hours. No results for input(s): AMMONIA in the last 168 hours. Coagulation Profile: No results for input(s): INR, PROTIME in the last 168 hours. Cardiac Enzymes: No results for input(s): CKTOTAL, CKMB, CKMBINDEX, TROPONINI in the last 168 hours. BNP (last 3 results) No results for input(s): PROBNP in the last 8760 hours. HbA1C: No results for input(s): HGBA1C in the last 72 hours. CBG: No results for input(s): GLUCAP in the last 168 hours. Lipid Profile: No results for input(s): CHOL, HDL, LDLCALC, TRIG, CHOLHDL, LDLDIRECT in the last 72 hours. Thyroid Function Tests: No results for input(s): TSH, T4TOTAL, FREET4, T3FREE, THYROIDAB in the last 72 hours. Anemia Panel: No results for input(s): VITAMINB12, FOLATE, FERRITIN, TIBC, IRON, RETICCTPCT in the last 72 hours. Urine analysis:  No results found for: COLORURINE, APPEARANCEUR, LABSPEC, PHURINE, GLUCOSEU, HGBUR, BILIRUBINUR, KETONESUR, PROTEINUR, UROBILINOGEN, NITRITE, LEUKOCYTESUR Sepsis Labs: @LABRCNTIP (procalcitonin:4,lacticidven:4)  ) No results found for this or any previous visit (from the past 240 hour(s)).    Radiology Studies: No results found.   Scheduled Meds: . amLODipine  10 mg Oral Daily  . atorvastatin  40 mg Oral Daily  . divalproex  125 mg Oral BID  . enoxaparin (LOVENOX) injection  40 mg Subcutaneous Q24H  . feeding supplement (ENSURE ENLIVE)  237 mL Oral BID BM  . feeding supplement (PRO-STAT SUGAR FREE 64)  30 mL Oral BID  . memantine  10 mg Oral BID  . multivitamin  1 tablet Oral Daily  . QUEtiapine  100 mg Oral QHS  . QUEtiapine  12.5 mg Oral QAC breakfast  . QUEtiapine  12.5 mg Oral Q1400  . vitamin C  1,000 mg Oral Daily   Continuous Infusions:   LOS: 12 days   Time Spent in minutes   20 minutes  Everette Dimauro D.O. on 02/22/2019 at 11:31 AM  Between 7am to 7pm - Please see pager noted on [email or web address]  After 7pm go  to [email or web address]  And look for the night coverage person covering for me after hours  [facility] Office  [phone]

## 2019-02-23 NOTE — Progress Notes (Signed)
Patient ID: Cameron West, male   DOB: Oct 19, 1955, 63 y.o.   MRN: [id number] Patient was seen and examined at bedside.  Patient is doing reasonably well tonight.  I have removed all of his sutures in the right hand.  Also debrided the skin.  He has no complications and is doing quite well.  I would recommend we simply cover the area over the next few days and then can begin the lotion and activities to pain tolerance.  Given all issues I do feel he is stable at this juncture to be seen as needed.  I will be immediately available should any problems occur.  Roseanne Kaufman MD cell phone [phone]

## 2019-02-23 NOTE — Progress Notes (Signed)
Patient ID: Cameron West, male   DOB: 1955/06/25, 63 y.o.   MRN: [id number]  This NP visited patient at the bedside as a follow up for palliative medicine needs and emotional support.  Patient remains confused,   but is calm and cooperative today.  Spoke to son/Cameron West by telephone for continued conversation regarding current medical situation.  At this time discussion continues around transition of care for this patient.    Social work is looking for long-term nursing options.  I discussed with patient's son my concern that options will be limited and location may not be convenient as far as finding a bed offer for his father.  He verbalizes an understanding and will will be open for offered safe disposition   Recommend outpatient community-based palliative services at discharge.  Discussed with son the importance of continued conversation with his family and the  medical providers regarding overall plan of care and treatment options,  ensuring decisions are within the context of the patients values and GOCs.  Questions and concerns addressed   Discussed with Thomasene Lot LCSW  Total time spent on the unit was 25 minutes  Greater than 50% of the time was spent in counseling and coordination of care  Wadie Lessen NP  Palliative Medicine Team Team Phone # [phone] Pager [phone]

## 2019-02-23 NOTE — Progress Notes (Signed)
Occupational Therapy Treatment Patient Details Name: Cameron West MRN: [id number] DOB: Aug 10, 1955 Today's Date: 02/23/2019    History of present illness  63 y.o. male with medical history significant of advanced dementia, traumatic brain injury, CAD status post PCI, hypertension, hyperlipidemia presented to the ED for evaluation of hand injury. S/p irrigation debridement and digital nerve and artery repair with irrigation and debridement right hand.    OT comments  Pt progressing toward established OT goals. Pt pleasant and agreeable to OT session. Pt required minA for grooming at sink level and minA for functional mobility in the room to simulate toilet transfer. Pt will continue to benefit from skilled OT services to maximize safety and independence with ADL/IADL and functional mobility. Will continue to follow acutely and progress as tolerated.    Follow Up Recommendations  SNF;Supervision/Assistance - 24 hour    Equipment Recommendations       Recommendations for Other Services      Precautions / Restrictions Precautions Precautions: Fall Restrictions Weight Bearing Restrictions: Yes RUE Weight Bearing: Weight bear through elbow only Other Position/Activity Restrictions: Verbal order per Amedeo Plenty, MD       Mobility Bed Mobility Overal bed mobility: Needs Assistance Bed Mobility: Supine to Sit     Supine to sit: Supervision        Transfers Overall transfer level: Needs assistance Equipment used: None Transfers: Sit to/from Stand Sit to Stand: Min assist         General transfer comment: minA for NWB status through hand     Balance Overall balance assessment: Needs assistance Sitting-balance support: No upper extremity supported;Feet supported Sitting balance-Leahy Scale: Fair     Standing balance support: No upper extremity supported;During functional activity Standing balance-Leahy Scale: Poor Standing balance comment: minA for standing balance                           ADL either performed or assessed with clinical judgement   ADL Overall ADL's : Needs assistance/impaired     Grooming: Min guard;Wash/dry face;Oral care Grooming Details (indicate cue type and reason): completed at sink level;vc for NWB through R hand                 Toilet Transfer: Min guard;Ambulation;Minimal assistance Toilet Transfer Details (indicate cue type and reason): simulated in room mobility no AD;minguard to minA during mobiltiy  Toileting- Clothing Manipulation and Hygiene: Minimal assistance;Cueing for safety;Sit to/from stand Toileting - Clothing Manipulation Details (indicate cue type and reason): vc for WB precautions     Functional mobility during ADLs: Minimal assistance General ADL Comments: minA for balance and safety;     Vision       Perception     Praxis      Cognition Arousal/Alertness: Awake/alert Behavior During Therapy: WFL for tasks assessed/performed Overall Cognitive Status: History of cognitive impairments - at baseline                                 General Comments: baseline TBI and dementia;slow processing        Exercises Exercises: Other exercises Other Exercises Other Exercises: gentle PROM through wrist, AROM through elbow and shoulder 10x    Shoulder Instructions       General Comments VSS throughout    Pertinent Vitals/ Pain       Pain Assessment: No/denies pain  Home Living  Prior Functioning/Environment              Frequency  Min 2X/week        Progress Toward Goals  OT Goals(current goals can now be found in the care plan section)  Progress towards OT goals: Progressing toward goals  Acute Rehab OT Goals Patient Stated Goal: did not state OT Goal Formulation: Patient unable to participate in goal setting Time For Goal Achievement: 03/09/19 Potential to Achieve Goals: Good ADL Goals Pt  Will Perform Grooming: with min guard assist;standing Pt Will Transfer to Toilet: with min guard assist;bedside commode Additional ADL Goal #1: Pt will follow simple, one step cues 50% of time for ADLs  Plan Discharge plan remains appropriate    Co-evaluation                 AM-PAC OT "6 Clicks" Daily Activity     Outcome Measure   Help from another person eating meals?: A Little Help from another person taking care of personal grooming?: A Little Help from another person toileting, which includes using toliet, bedpan, or urinal?: A Little Help from another person bathing (including washing, rinsing, drying)?: A Little Help from another person to put on and taking off regular upper body clothing?: A Little Help from another person to put on and taking off regular lower body clothing?: A Little 6 Click Score: 18    End of Session Equipment Utilized During Treatment: Gait belt  OT Visit Diagnosis: Unsteadiness on feet (R26.81);Other abnormalities of gait and mobility (R26.89);Muscle weakness (generalized) (M62.81);Other symptoms and signs involving cognitive function   Activity Tolerance Patient tolerated treatment well   Patient Left in chair;with call bell/phone within reach;with chair alarm set   Nurse Communication Mobility status        Time: [id number] OT Time Calculation (min): 15 min  Charges: OT General Charges $OT Visit: 1 Visit OT Treatments $Self Care/Home Management : 8-22 mins  Diona Browner OTR/L Acute Rehabilitation Services Office: [phone]    [name] 02/23/2019, 12:47 PM

## 2019-02-23 NOTE — Progress Notes (Signed)
PROGRESS NOTE    Cameron West  XVQ:[id number] DOB: 19-Nov-1955 DOA: 02/09/2019 PCP: Alfonse Spruce, FNP   Brief Narrative:  HPI on 02/09/2019 by Dr. Owens Shark is a 63 y.o. male with medical history significant of advanced dementia, traumatic brain injury, CAD status post PCI, hypertension, hyperlipidemia presented to the ED for evaluation of hand injury.  Family reported that patient fell while holding a drinking glass and lacerated his hand.  X-ray showing no foreign body.  Hand surgery was consulted.  Patient was taken to the OR and discovered to have radial digital nerve and artery injury to the index finger as well as significant thenar and abductor muscle injury.  He underwent irrigation and debridement, radial digital nerve and artery repair, and complex wound closure of the first webspace of the right hand.  Dr. Amedeo Plenty requested admission for observation under hospitalist service as patient noted to be hypertensive and bladder scan revealed >600 cc urine for which in and out cath was ordered.  Recommended continuing antibiotic for 24 hours.  Patient is currently very somnolent and no history could be obtained from him.  Interim history Noted with laceration of the right hand, status post I&D, radial digital nerve repair, artery repair. Pending SNF placement. Assessment & Plan   Laceration of the right hand -Orthopedic/hand surgery consulted and appreciated -Status post irrigation and debridement, radial digital nerve repair, artery repair, complex wound closure of the first webspace of the right hand on 02/09/2019 -Continue wound care per surgery -Antibiotics were discontinued 02/12/2019 but hand surgery -Dr. Amedeo Plenty removed volar splint on 11/11, recommended dry dressing on the area with clean gauze and Ace wrap.  Can change as often as necessary but would like to wait another 5 to 7 days until stitches are removed.  Hypertensive urgency -Continue amlodipine, BP  controlled  Advanced dementia/agitation -Continue Haldol/Ativan as needed for agitation -Palliative care consulted and appreciated to discuss goals of care -Continue memantine -Psychiatry consulted and appreciated, continue Seroquel and Depakote per psychiatry recommendations  Deconditioning -PT recommended SNF placement -Social work consulted  Acute urinary retention -Continue now catheterizations for now  Hyperlipidemia -Continue statin  DVT Prophylaxis  SCDs/ Lovenox  Code Status: DNR  Family Communication: None at bedside  Disposition Plan: Admitted. Pending SNF placement. Patient still requiring assistance and constant instruction for daily activities.   Consultants Hand surgery, Dr. Amedeo Plenty  Procedures  Irrigation and debridement, radial digital nerve repair and artery repair and complex wound closure of the first webspace of the right hand on 02/09/2019  Antibiotics   Anti-infectives (From admission, onward)   Start     Dose/Rate Route Frequency Ordered Stop   02/10/19 0600  ceFAZolin (ANCEF) IVPB 2g/100 mL premix  Status:  Discontinued     2 g 200 mL/hr over 30 Minutes Intravenous On call to O.R. 02/09/19 1955 02/10/19 0028   02/10/19 0600  ceFAZolin (ANCEF) IVPB 1 g/50 mL premix  Status:  Discontinued     1 g 100 mL/hr over 30 Minutes Intravenous Every 8 hours 02/10/19 0144 02/12/19 0753   02/10/19 0030  ceFAZolin (ANCEF) IVPB 1 g/50 mL premix     1 g 100 mL/hr over 30 Minutes Intravenous NOW 02/10/19 0027 02/10/19 0243      Subjective:   Cameron West seen and examined today.  Patient not conversant, just stares at me.  No complaints today, just shakes his head at questions. Objective:   Vitals:   02/22/19 0608 02/22/19 1300 02/22/19  2119 02/23/19 0519  BP: 110/73 112/76 136/86 107/69  Pulse: (!) 49 78 (!) 49 (!) 52  Resp: 14 18 16 15   Temp: 98.1 F (36.7 C) 98.2 F (36.8 C) (!) 97.5 F (36.4 C) 98.3 F (36.8 C)  TempSrc: Oral Oral Oral Oral  SpO2:  99% 100% 100% 100%  Weight:      Height:        Intake/Output Summary (Last 24 hours) at 02/23/2019 1213 Last data filed at 02/23/2019 0815 Gross per 24 hour  Intake 720 ml  Output -  Net 720 ml   Filed Weights   02/16/19 0345 02/17/19 0410 02/20/19 0700  Weight: 88 kg 87 kg 84 kg   Exam  General: Well developed, chronically ill appearing, NAD  HEENT: NCAT, mucous membranes moist.   Cardiovascular: S1 S2 auscultated, RRR  Respiratory: Clear to auscultation bilaterally with equal chest rise  Abdomen: Soft, nontender, nondistended, + bowel sounds  Extremities: warm dry without cyanosis clubbing or edema. Right hand dressing in place  Neuro: Awake and alert, dementia, nonfocal  Psych: flat  Data Reviewed: I have personally reviewed following labs and imaging studies  CBC: No results for input(s): WBC, NEUTROABS, HGB, HCT, MCV, PLT in the last 168 hours. Basic Metabolic Panel: Recent Labs  Lab 02/18/19 0256  NA 143  K 4.2  CL 107  CO2 27  GLUCOSE 94  BUN 26*  CREATININE 0.59*  CALCIUM 9.8   GFR: Estimated Creatinine Clearance: 112.7 mL/min (A) (by C-G formula based on SCr of 0.59 mg/dL (L)). Liver Function Tests: No results for input(s): AST, ALT, ALKPHOS, BILITOT, PROT, ALBUMIN in the last 168 hours. No results for input(s): LIPASE, AMYLASE in the last 168 hours. No results for input(s): AMMONIA in the last 168 hours. Coagulation Profile: No results for input(s): INR, PROTIME in the last 168 hours. Cardiac Enzymes: No results for input(s): CKTOTAL, CKMB, CKMBINDEX, TROPONINI in the last 168 hours. BNP (last 3 results) No results for input(s): PROBNP in the last 8760 hours. HbA1C: No results for input(s): HGBA1C in the last 72 hours. CBG: No results for input(s): GLUCAP in the last 168 hours. Lipid Profile: No results for input(s): CHOL, HDL, LDLCALC, TRIG, CHOLHDL, LDLDIRECT in the last 72 hours. Thyroid Function Tests: No results for input(s): TSH,  T4TOTAL, FREET4, T3FREE, THYROIDAB in the last 72 hours. Anemia Panel: No results for input(s): VITAMINB12, FOLATE, FERRITIN, TIBC, IRON, RETICCTPCT in the last 72 hours. Urine analysis: No results found for: COLORURINE, APPEARANCEUR, LABSPEC, PHURINE, GLUCOSEU, HGBUR, BILIRUBINUR, KETONESUR, PROTEINUR, UROBILINOGEN, NITRITE, LEUKOCYTESUR Sepsis Labs: @LABRCNTIP (procalcitonin:4,lacticidven:4)  ) No results found for this or any previous visit (from the past 240 hour(s)).    Radiology Studies: No results found.   Scheduled Meds: . amLODipine  10 mg Oral Daily  . atorvastatin  40 mg Oral Daily  . divalproex  125 mg Oral BID  . enoxaparin (LOVENOX) injection  40 mg Subcutaneous Q24H  . feeding supplement (ENSURE ENLIVE)  237 mL Oral BID BM  . feeding supplement (PRO-STAT SUGAR FREE 64)  30 mL Oral BID  . memantine  10 mg Oral BID  . multivitamin  1 tablet Oral Daily  . QUEtiapine  100 mg Oral QHS  . QUEtiapine  12.5 mg Oral QAC breakfast  . QUEtiapine  12.5 mg Oral Q1400  . vitamin C  1,000 mg Oral Daily   Continuous Infusions:   LOS: 13 days   Time Spent in minutes   20 minutes  Leyana Whidden  Janella Rogala D.O. on 02/23/2019 at 12:13 PM  Between 7am to 7pm - Please see pager noted on [email or web address]  After 7pm go to [email or web address]  And look for the night coverage person covering for me after hours  [facility] Office  [phone]

## 2019-02-23 NOTE — Progress Notes (Signed)
   02/22/19 2119  MEWS Score  Resp 16  Pulse Rate (!) 49  BP 136/86  Temp (!) 97.5 F (36.4 C)  SpO2 100 %  O2 Device Room Air  MEWS Score  MEWS RR 0  MEWS Pulse 1  MEWS Systolic 0  MEWS LOC 0  MEWS Temp 0  MEWS Score 1  MEWS Score Color Green  MEWS Assessment  Is this an acute change? No   Lab Results WBC  Date/Time Value Ref Range Status  02/11/2019 02:43 AM 5.1 4.0 - 10.5 K/uL Final  02/09/2019 01:20 PM 8.1 4.0 - 10.5 K/uL Final  01/03/2017 10:13 AM 4.8 3.4 - 10.8 x10E3/uL Final  11/26/2016 02:05 AM 5.8 4.0 - 10.5 K/uL Final   Neutrophils Relative %  Date/Time Value Ref Range Status  02/11/2019 02:43 AM 53 % Final  02/09/2019 01:20 PM 69 % Final   No results found for: PCO2ART No results found for: LATICACIDVEN No results found for: PCO2VEN

## 2019-02-23 NOTE — Plan of Care (Signed)
  Problem: Education: Goal: Knowledge of General Education information will improve Description: Including pain rating scale, medication(s)/side effects and non-pharmacologic comfort measures Outcome: Not Progressing Note: Confusion   Problem: Health Behavior/Discharge Planning: Goal: Ability to manage health-related needs will improve Outcome: Not Progressing Note: Confusion   Problem: Clinical Measurements: Goal: Diagnostic test results will improve Outcome: Progressing   Problem: Coping: Goal: Level of anxiety will decrease Outcome: Progressing   Problem: Safety: Goal: Ability to remain free from injury will improve Outcome: Not Progressing Note: Confusion

## 2019-02-24 LAB — BASIC METABOLIC PANEL
Anion gap: 8 (ref 5–15)
BUN: 27 mg/dL — ABNORMAL HIGH (ref 8–23)
CO2: 26 mmol/L (ref 22–32)
Calcium: 9.3 mg/dL (ref 8.9–10.3)
Chloride: 105 mmol/L (ref 98–111)
Creatinine, Ser: 0.56 mg/dL — ABNORMAL LOW (ref 0.61–1.24)
GFR calc Af Amer: >60 mL/min (ref >60)
GFR calc non Af Amer: >60 mL/min (ref >60)
Glucose, Bld: 110 mg/dL — ABNORMAL HIGH (ref 70–99)
Potassium: 3.9 mmol/L (ref 3.5–5.1)
Sodium: 139 mmol/L (ref 135–145)

## 2019-02-24 LAB — CBC
HCT: 36 % — ABNORMAL LOW (ref 39.0–52.0)
Hemoglobin: 12 g/dL — ABNORMAL LOW (ref 13.0–17.0)
MCH: 30 pg (ref 26.0–34.0)
MCHC: 33.3 g/dL (ref 30.0–36.0)
MCV: 90 fL (ref 80.0–100.0)
Platelets: 192 10*3/uL (ref 150–400)
RBC: 4 MIL/uL — ABNORMAL LOW (ref 4.22–5.81)
RDW: 13.1 % (ref 11.5–15.5)
WBC: 4.6 10*3/uL (ref 4.0–10.5)
nRBC: 0 % (ref 0.0–0.2)

## 2019-02-24 NOTE — Progress Notes (Signed)
Physical Therapy Treatment Patient Details Name: Cameron West MRN: [id number] DOB: 1955/11/20 Today's Date: 02/24/2019    History of Present Illness  63 y.o. male with medical history significant of advanced dementia, traumatic brain injury, CAD status post PCI, hypertension, hyperlipidemia presented to the ED for evaluation of hand injury. S/p irrigation debridement and digital nerve and artery repair with irrigation and debridement right hand.     PT Comments    Patient able to follow most commands with demonstration, verbal, and tactile cues. Static standing balance (including reaching outside his BOS) with no imbalance throughout session. Continues with imbalance during gait (especially when he turns his head left or right).     Follow Up Recommendations  SNF;Supervision/Assistance - 24 hour     Equipment Recommendations  None recommended by PT    Recommendations for Other Services       Precautions / Restrictions Precautions Precautions: Fall Restrictions Weight Bearing Restrictions: Yes RUE Weight Bearing: Weight bearing as tolerated Other Position/Activity Restrictions: Verbal order per Amanda Pea, MD    Mobility  Bed Mobility Overal bed mobility: Needs Assistance Bed Mobility: Supine to Sit     Supine to sit: Supervision Sit to supine: Supervision   General bed mobility comments: Basic commands to move back to bed.  Transfers Overall transfer level: Needs assistance Equipment used: None Transfers: Sit to/from Stand Sit to Stand: Min guard         General transfer comment: for safety; tactile cues to facilitate transfer  Ambulation/Gait Ambulation/Gait assistance: Min assist Gait Distance (Feet): 75 Feet(x4; standing rests/activities between) Assistive device: None Gait Pattern/deviations: Step-through pattern;Drifts right/left;Decreased stride length;Trunk flexed Gait velocity: decreased; requires demonstration and tactile cues to incr velocity, but only  increases a small degree   General Gait Details: Patient with LOB with looking to his right requiring min assist to recover; able to make sudden stops; 180 degree turns; walk backwards however consistently drifts when looking around while walking   Stairs             Wheelchair Mobility    Modified Rankin (Stroke Patients Only)       Balance Overall balance assessment: Needs assistance Sitting-balance support: No upper extremity supported;Feet supported Sitting balance-Leahy Scale: Fair     Standing balance support: No upper extremity supported;During functional activity Standing balance-Leahy Scale: Fair Standing balance comment: twice able to reach to floor to pick up trash with no imbalance Single Leg Stance - Right Leg: 20(x 4 reps) Single Leg Stance - Left Leg: 30(x4 reps)         High level balance activites: Backward walking;Direction changes;Turns;Sudden stops;Head turns High Level Balance Comments: see gait comments            Cognition Arousal/Alertness: Awake/alert Behavior During Therapy: WFL for tasks assessed/performed Overall Cognitive Status: History of cognitive impairments - at baseline                                 General Comments: baseline TBI and dementia;slow processing      Exercises General Exercises - Lower Extremity Hip ABduction/ADduction: AROM;Both;10 reps;Standing Hip Flexion/Marching: Both;10 reps;Standing Toe Raises: Other (comment)(attempted; pt could not imitate/understand) Mini-Sqauts: Strengthening;Both;10 reps;Standing    General Comments General comments (skin integrity, edema, etc.): son present; reports patient could walk around the house all day by himself without difficulty      Pertinent Vitals/Pain Faces Pain Scale: Hurts little more Pain Location: movement of  right elbow Pain Descriptors / Indicators: Guarding;Grimacing;Discomfort    Home Living                      Prior  Function            PT Goals (current goals can now be found in the care plan section) Acute Rehab PT Goals Patient Stated Goal: did not state PT Goal Formulation: Patient unable to participate in goal setting Time For Goal Achievement: 03/10/19 Potential to Achieve Goals: Fair Progress towards PT goals: Progressing toward goals(goals updated based on timeframe)    Frequency    Min 2X/week      PT Plan Current plan remains appropriate    Co-evaluation              AM-PAC PT "6 Clicks" Mobility   Outcome Measure  Help needed turning from your back to your side while in a flat bed without using bedrails?: None Help needed moving from lying on your back to sitting on the side of a flat bed without using bedrails?: None Help needed moving to and from a bed to a chair (including a wheelchair)?: A Little Help needed standing up from a chair using your arms (e.g., wheelchair or bedside chair)?: A Little Help needed to walk in hospital room?: A Little Help needed climbing 3-5 steps with a railing? : A Little 6 Click Score: 20    End of Session Equipment Utilized During Treatment: Gait belt Activity Tolerance: Patient tolerated treatment well Patient left: in bed;with call bell/phone within reach;with bed alarm set;with family/visitor present Nurse Communication: Mobility status PT Visit Diagnosis: Unsteadiness on feet (R26.81)     Time: 4166-0630 PT Time Calculation (min) (ACUTE ONLY): 27 min  Charges:  $Gait Training: 8-22 mins $Therapeutic Exercise: 8-22 mins                      Barry Brunner, PT Pager [phone]    Rexanne Mano 02/24/2019, 5:08 PM

## 2019-02-24 NOTE — Progress Notes (Signed)
Patient ID: Cameron West, male   DOB: [birth year], 63 y.o.   MRN: [id number]  PROGRESS NOTE    Cameron West  MWN:[id number] DOB: Nov 21, 1955 DOA: 02/09/2019 PCP: Cameron Spruce, FNP   Brief Narrative:  63 year old male with history of advanced dementia, traumatic brain injury, CAD status post PCI, hypertension, hyperlipidemia presented with hand injury.  Hand surgery was consulted and he underwent irrigation and debridement, radial digital nerve repair and artery repair and complex wound closure of the first webspace of the right hand on 02/09/2019.  He is currently awaiting SNF placement.  Assessment & Plan:   Laceration of right hand -Status post  irrigation and debridement, radial digital nerve repair and artery repair and complex wound closure of the first webspace of the right hand on 02/09/2019. -Wound care as per surgeon.  Antibiotics discontinued on 02/12/2019 by hand surgery.  Sutures were removed on 02/23/2019 by hand surgery.  Hypertensive urgency -Blood pressure improved.  Continue amlodipine.  Advanced dementia Agitation Generalized deconditioning -Continue Haldol/Ativan as needed for agitation.   -Continue memantine. -PT recommends SNF placement.  Social worker following. -Psychiatry evaluation appreciated.  Continue Seroquel and Depakote as per psychiatry recommendations.    -Palliative care following.  Currently DNR.  Acute urinary retention -Continue in and out catheterization for now.  May need Foley if continues to have urinary retention  Hyperlipidemia -Continue statin.  DVT prophylaxis: Lovenox. Code Status: DNR  family Communication: None at bedside Disposition Plan: SNF once bed is available Consultants: Hand surgery  Procedures:   irrigation and debridement, radial digital nerve repair and artery repair and complex wound closure of the first webspace of the right hand on 02/09/2019.  Antimicrobials:  Anti-infectives (From admission, onward)   Start      Dose/Rate Route Frequency Ordered Stop   02/10/19 0600  ceFAZolin (ANCEF) IVPB 2g/100 mL premix  Status:  Discontinued     2 g 200 mL/hr over 30 Minutes Intravenous On call to O.R. 02/09/19 1955 02/10/19 0028   02/10/19 0600  ceFAZolin (ANCEF) IVPB 1 g/50 mL premix  Status:  Discontinued     1 g 100 mL/hr over 30 Minutes Intravenous Every 8 hours 02/10/19 0144 02/12/19 0753   02/10/19 0030  ceFAZolin (ANCEF) IVPB 1 g/50 mL premix     1 g 100 mL/hr over 30 Minutes Intravenous NOW 02/10/19 0027 02/10/19 0243       Subjective: Patient seen and examined at bedside.  Very poor historian.  Awake but hardly participates in any conversation.  No overnight fever, vomiting reported.   Objective: Vitals:   02/23/19 0519 02/23/19 1357 02/23/19 2206 02/24/19 0523  BP: 107/69 109/75 123/79 101/66  Pulse: (!) 52 66 (!) 52 (!) 50  Resp: 15 20 16 14   Temp: 98.3 F (36.8 C) 98.3 F (36.8 C) 98 F (36.7 C) 98.7 F (37.1 C)  TempSrc: Oral Oral Oral   SpO2: 100% 100% 100% 100%  Weight:      Height:        Intake/Output Summary (Last 24 hours) at 02/24/2019 1252 Last data filed at 02/23/2019 1842 Gross per 24 hour  Intake 960 ml  Output -  Net 960 ml   Filed Weights   02/16/19 0345 02/17/19 0410 02/20/19 0700  Weight: 88 kg 87 kg 84 kg    Examination:  General exam: No acute distress.  No signs of agitation.  Awake but hardly answers any questions.   Respiratory system: Bilateral decreased breath sounds at bases  cardiovascular system: Rate controlled, S1-S2 heard Gastrointestinal system: Abdomen is nondistended, soft and nontender. Normal bowel sounds heard. Extremities: No cyanosis.  Right hand dressing present  Data Reviewed: I have personally reviewed following labs and imaging studies  CBC: Recent Labs  Lab 02/24/19 0236  WBC 4.6  HGB 12.0*  HCT 36.0*  MCV 90.0  PLT 192   Basic Metabolic Panel: Recent Labs  Lab 02/18/19 0256 02/24/19 0236  NA 143 139  K 4.2 3.9   CL 107 105  CO2 27 26  GLUCOSE 94 110*  BUN 26* 27*  CREATININE 0.59* 0.56*  CALCIUM 9.8 9.3   GFR: Estimated Creatinine Clearance: 112.7 mL/min (A) (by C-G formula based on SCr of 0.56 mg/dL (L)). Liver Function Tests: No results for input(s): AST, ALT, ALKPHOS, BILITOT, PROT, ALBUMIN in the last 168 hours. No results for input(s): LIPASE, AMYLASE in the last 168 hours. No results for input(s): AMMONIA in the last 168 hours. Coagulation Profile: No results for input(s): INR, PROTIME in the last 168 hours. Cardiac Enzymes: No results for input(s): CKTOTAL, CKMB, CKMBINDEX, TROPONINI in the last 168 hours. BNP (last 3 results) No results for input(s): PROBNP in the last 8760 hours. HbA1C: No results for input(s): HGBA1C in the last 72 hours. CBG: No results for input(s): GLUCAP in the last 168 hours. Lipid Profile: No results for input(s): CHOL, HDL, LDLCALC, TRIG, CHOLHDL, LDLDIRECT in the last 72 hours. Thyroid Function Tests: No results for input(s): TSH, T4TOTAL, FREET4, T3FREE, THYROIDAB in the last 72 hours. Anemia Panel: No results for input(s): VITAMINB12, FOLATE, FERRITIN, TIBC, IRON, RETICCTPCT in the last 72 hours. Sepsis Labs: No results for input(s): PROCALCITON, LATICACIDVEN in the last 168 hours.  No results found for this or any previous visit (from the past 240 hour(s)).       Radiology Studies: No results found.      Scheduled Meds: . amLODipine  10 mg Oral Daily  . atorvastatin  40 mg Oral Daily  . divalproex  125 mg Oral BID  . enoxaparin (LOVENOX) injection  40 mg Subcutaneous Q24H  . feeding supplement (ENSURE ENLIVE)  237 mL Oral BID BM  . feeding supplement (PRO-STAT SUGAR FREE 64)  30 mL Oral BID  . memantine  10 mg Oral BID  . multivitamin  1 tablet Oral Daily  . QUEtiapine  100 mg Oral QHS  . QUEtiapine  12.5 mg Oral QAC breakfast  . QUEtiapine  12.5 mg Oral Q1400  . vitamin C  1,000 mg Oral Daily   Continuous Infusions:          Cameron Lloyd, MD [facility] 02/24/2019, 12:52 PM

## 2019-02-24 NOTE — Plan of Care (Signed)
  Problem: Education: Goal: Knowledge of General Education information will improve Description: Including pain rating scale, medication(s)/side effects and non-pharmacologic comfort measures Outcome: Progressing   Problem: Health Behavior/Discharge Planning: Goal: Ability to manage health-related needs will improve Outcome: Progressing   Problem: Clinical Measurements: Goal: Diagnostic test results will improve Outcome: Progressing   Problem: Coping: Goal: Level of anxiety will decrease Outcome: Progressing   Problem: Safety: Goal: Ability to remain free from injury will improve Outcome: Progressing

## 2019-02-24 NOTE — TOC Progression Note (Addendum)
Transition of Care [facility]) - Progression Note    Patient Details  Name: Cameron West MRN: [id number] Date of Birth: [birth year]  Transition of Care [facility]) CM/SW [place], LCSW Phone Number: 02/24/2019, 1:46 PM  Clinical Narrative:    1:46pm-CSW contacted [facility] liaison to inquire about memory care facility options; awaiting response on availability.    3:20pm-[facility] checking on bed availability for their [facility] building. No long term availability at [facility] and [facility] and [facility] do not have memory care units. CSW updated patient's son and he is agreeable to whichever building is able to care for patient.     Expected Discharge Plan: Long Term Nursing Home Barriers to Discharge: Continued Medical Work up  Expected Discharge Plan and Services Expected Discharge Plan: [place]                                               Social Determinants of Health (SDOH) Interventions    Readmission Risk Interventions No flowsheet data found.

## 2019-02-25 LAB — SARS CORONAVIRUS 2 (TAT 6-24 HRS): SARS Coronavirus 2: NEGATIVE

## 2019-02-25 NOTE — Progress Notes (Signed)
Patient ID: Cameron West, male   DOB: August 02, 1955, 63 y.o.   MRN: [id number]  PROGRESS NOTE    Cameron West  NIO:[id number] DOB: Oct 08, 1955 DOA: 02/09/2019 PCP: Lizbeth Bark, FNP   Brief Narrative:  63 year old male with history of advanced dementia, traumatic brain injury, CAD status post PCI, hypertension, hyperlipidemia presented with hand injury.  Hand surgery was consulted and he underwent irrigation and debridement, radial digital nerve repair and artery repair and complex wound closure of the first webspace of the right hand on 02/09/2019.  He is currently awaiting SNF placement.  Assessment & Plan:   Laceration of right hand -Status post  irrigation and debridement, radial digital nerve repair and artery repair and complex wound closure of the first webspace of the right hand on 02/09/2019. -Wound care as per surgeon.  Antibiotics discontinued on 02/12/2019 by hand surgery.  Sutures were removed on 02/23/2019 by hand surgery.  Hypertensive urgency -Blood pressure improved.  Continue amlodipine.  Advanced dementia Agitation Generalized deconditioning -Continue Haldol/Ativan as needed for agitation.   -Continue memantine. -PT recommends SNF placement.  Social worker following. -Psychiatry evaluation appreciated.  Continue Seroquel and Depakote as per psychiatry recommendations.    -Palliative care following.  Currently DNR.  Acute urinary retention -Continue in and out catheterization for now.  May need Foley if continues to have urinary retention  Hyperlipidemia -Continue statin.  DVT prophylaxis: Lovenox. Code Status: DNR  family Communication: None at bedside Disposition Plan: SNF once bed is available Consultants: Hand surgery  Procedures:   irrigation and debridement, radial digital nerve repair and artery repair and complex wound closure of the first webspace of the right hand on 02/09/2019.  Antimicrobials:  Anti-infectives (From admission, onward)   Start      Dose/Rate Route Frequency Ordered Stop   02/10/19 0600  ceFAZolin (ANCEF) IVPB 2g/100 mL premix  Status:  Discontinued     2 g 200 mL/hr over 30 Minutes Intravenous On call to O.R. 02/09/19 1955 02/10/19 0028   02/10/19 0600  ceFAZolin (ANCEF) IVPB 1 g/50 mL premix  Status:  Discontinued     1 g 100 mL/hr over 30 Minutes Intravenous Every 8 hours 02/10/19 0144 02/12/19 0753   02/10/19 0030  ceFAZolin (ANCEF) IVPB 1 g/50 mL premix     1 g 100 mL/hr over 30 Minutes Intravenous NOW 02/10/19 0027 02/10/19 0243       Subjective: Patient seen and examined at bedside.  Very poor historian.  Does not participate in any conversation.  No overnight fever or agitation reported. Objective: Vitals:   02/24/19 0523 02/24/19 1500 02/24/19 2132 02/25/19 0541  BP: 101/66 109/77 120/85 101/61  Pulse: (!) 50 61 (!) 57 (!) 52  Resp: 14 19 16 16   Temp: 98.7 F (37.1 C) 98.3 F (36.8 C) 98.6 F (37 C) (!) 97.5 F (36.4 C)  TempSrc:  Oral Oral Oral  SpO2: 100% 100% 100% 100%  Weight:      Height:        Intake/Output Summary (Last 24 hours) at 02/25/2019 0813 Last data filed at 02/24/2019 1500 Gross per 24 hour  Intake 960 ml  Output -  Net 960 ml   Filed Weights   02/16/19 0345 02/17/19 0410 02/20/19 0700  Weight: 88 kg 87 kg 84 kg    Examination:  General exam: No distress.  No signs of agitation.  Awake but hardly answers any questions.   Respiratory system: Bilateral decreased breath sounds at bases cardiovascular system:  Rate controlled, S1-S2 heard Gastrointestinal system: Abdomen is nondistended, soft and nontender. Normal bowel sounds heard. Extremities: No cyanosis.  Right hand dressing present  Data Reviewed: I have personally reviewed following labs and imaging studies  CBC: Recent Labs  Lab 02/24/19 0236  WBC 4.6  HGB 12.0*  HCT 36.0*  MCV 90.0  PLT 093   Basic Metabolic Panel: Recent Labs  Lab 02/24/19 0236  NA 139  K 3.9  CL 105  CO2 26  GLUCOSE 110*   BUN 27*  CREATININE 0.56*  CALCIUM 9.3   GFR: Estimated Creatinine Clearance: 112.7 mL/min (A) (by C-G formula based on SCr of 0.56 mg/dL (L)). Liver Function Tests: No results for input(s): AST, ALT, ALKPHOS, BILITOT, PROT, ALBUMIN in the last 168 hours. No results for input(s): LIPASE, AMYLASE in the last 168 hours. No results for input(s): AMMONIA in the last 168 hours. Coagulation Profile: No results for input(s): INR, PROTIME in the last 168 hours. Cardiac Enzymes: No results for input(s): CKTOTAL, CKMB, CKMBINDEX, TROPONINI in the last 168 hours. BNP (last 3 results) No results for input(s): PROBNP in the last 8760 hours. HbA1C: No results for input(s): HGBA1C in the last 72 hours. CBG: No results for input(s): GLUCAP in the last 168 hours. Lipid Profile: No results for input(s): CHOL, HDL, LDLCALC, TRIG, CHOLHDL, LDLDIRECT in the last 72 hours. Thyroid Function Tests: No results for input(s): TSH, T4TOTAL, FREET4, T3FREE, THYROIDAB in the last 72 hours. Anemia Panel: No results for input(s): VITAMINB12, FOLATE, FERRITIN, TIBC, IRON, RETICCTPCT in the last 72 hours. Sepsis Labs: No results for input(s): PROCALCITON, LATICACIDVEN in the last 168 hours.  No results found for this or any previous visit (from the past 240 hour(s)).       Radiology Studies: No results found.      Scheduled Meds: . amLODipine  10 mg Oral Daily  . atorvastatin  40 mg Oral Daily  . divalproex  125 mg Oral BID  . enoxaparin (LOVENOX) injection  40 mg Subcutaneous Q24H  . feeding supplement (ENSURE ENLIVE)  237 mL Oral BID BM  . feeding supplement (PRO-STAT SUGAR FREE 64)  30 mL Oral BID  . memantine  10 mg Oral BID  . multivitamin  1 tablet Oral Daily  . QUEtiapine  100 mg Oral QHS  . QUEtiapine  12.5 mg Oral QAC breakfast  . QUEtiapine  12.5 mg Oral Q1400  . vitamin C  1,000 mg Oral Daily   Continuous Infusions:         Aline August, MD [facility] 02/25/2019,  8:13 AM

## 2019-02-25 NOTE — TOC Progression Note (Addendum)
Transition of Care [facility]) - Progression Note    Patient Details  Name: Cameron West MRN: [id number] Date of Birth: September 16, 1955  [facility]) CM/SW [place], LCSW Phone Number: 02/25/2019, 9:08 AM  Clinical Narrative:    9am-Per [facility] Liaison, none of their buildings have availability right now. [facility] checking their bed status.   10:53am- [facility] able to accept patient long term. CSW updated patient's son, who expressed understanding. MD ordering COVID test for potential discharge tomorrow.    Expected Discharge Plan: Long Term Nursing Home Barriers to Discharge: Continued Medical Work up  Expected Discharge Plan and Services Expected Discharge Plan: [place]                                               Social Determinants of Health (SDOH) Interventions    Readmission Risk Interventions No flowsheet data found.

## 2019-02-25 NOTE — Progress Notes (Signed)
Nutrition Follow-up  DOCUMENTATION CODES:   Not applicable  INTERVENTION:  - continue Ensure Enlive BID and 30 ml prostat BID. - continue to encourage PO intakes.    NUTRITION DIAGNOSIS:   Increased nutrient needs related to wound healing(I/D; radial digital nerve and artery repair; complex wound closure) as evidenced by estimated needs. -ongoing  GOAL:   Patient will meet greater than or equal to 90% of their needs -met  MONITOR:   PO intake, Labs, I & O's, Supplement acceptance, Skin, Weight trends  ASSESSMENT:   63 year old male with past medical history significant for advanced dementia, TBI, CAD s/p PCI, HTN, HLD who presented to ED for evaluation of right hand laceration after patient fell while holding a drinking glass. Surgery consulted; patient taken to OR and discovered to have radial nerve and artery injury to the index finger as well as significant thenar and abductor muscle injury. Patient underwent irrigation and debridement, radial digital nerve and artery repair with complex wound closure of first webspace of right hand.  Patient has not been weighed since 11/14. Weight on that date was the lowest it has been this hospitalization. Patient continues to eat mainly 100% of meals and he has been accepting Ensure and prostat 100% of the time offered over the past week.   Per notes: - R hand laceration s/p I&D--sutures removed 11/17 - HTN urgency--BP improved - advanced dementia  - generalized deconditioning  - Palliative following--DNR - PT recommends SNF--plan once bed is available - acute urinary retention     Labs reviewed; BUN: 27 mg/dl, creatinine: 0.56 mg/dl. Medications reviewed; 1 tablet prosight/day, 1000 mg ascorbic acid/day.    Diet Order:   Diet Order            Diet regular Room service appropriate? No; Fluid consistency: Thin  Diet effective now              EDUCATION NEEDS:   No education needs have been identified at this time  Skin:   Skin Assessment: Reviewed RN Assessment(incision;closed;right;hand)  Last BM:  11/17  Height:   Ht Readings from Last 1 Encounters:  02/13/19 '6\' 2"'  (1.88 m)    Weight:   Wt Readings from Last 1 Encounters:  02/20/19 84 kg    Ideal Body Weight:  80.9 kg  BMI:  Body mass index is 23.78 kg/m.  Estimated Nutritional Needs:   Kcal:  2200-2400 (MSJ 1.1-1.2)  Protein:  110-120  Fluid:  >/= 2.2 L/day     Jarome Matin, MS, RD, LDN, [facility] Inpatient Clinical Dietitian Pager # [phone] After hours/weekend pager # [phone]

## 2019-02-25 NOTE — Plan of Care (Signed)
  Problem: Education: Goal: Knowledge of General Education information will improve Description: Including pain rating scale, medication(s)/side effects and non-pharmacologic comfort measures Outcome: Progressing   Problem: Health Behavior/Discharge Planning: Goal: Ability to manage health-related needs will improve Outcome: Progressing   Problem: Clinical Measurements: Goal: Diagnostic test results will improve Outcome: Progressing   Problem: Coping: Goal: Level of anxiety will decrease Outcome: Progressing   Problem: Safety: Goal: Ability to remain free from injury will improve Outcome: Progressing   

## 2019-02-26 MED ORDER — DIVALPROEX SODIUM 125 MG PO CSDR: 125.0000 mg | DELAYED_RELEASE_CAPSULE | Freq: Two times a day (BID) | ORAL | 0 refills | Status: AC

## 2019-02-26 MED ORDER — AMLODIPINE BESYLATE 10 MG PO TABS: 10.0000 mg | ORAL_TABLET | Freq: Every day | ORAL | 0 refills | Status: AC

## 2019-02-26 MED ORDER — QUETIAPINE FUMARATE 25 MG PO TABS: ORAL_TABLET | ORAL | 0 refills | Status: AC

## 2019-02-26 MED ORDER — QUETIAPINE FUMARATE 100 MG PO TABS: 100.0000 mg | ORAL_TABLET | Freq: Every day | ORAL | 0 refills | Status: AC

## 2019-02-26 NOTE — TOC Transition Note (Addendum)
Transition of Care [facility]) - CM/SW Discharge Note   Patient Details  Name: Cameron West MRN: [id number] Date of Birth: 01/16/1956  Transition of Care [facility]) CM/SW Contact:  Benard Halsted, LCSW Phone Number: 02/26/2019, 2:11 PM   Clinical Narrative:    Patient will DC to: [facility] Anticipated DC date: 02/26/19 Family notified: Leodis Rains Transport by: Corey Harold   Per MD patient ready for DC to [facility] RN, patient, patient's family, and facility notified of DC. Discharge Summary and FL2 sent to facility. RN to call report prior to discharge [phone] Room 403). DC packet on chart. Ambulance transport requested for patient.   CSW will sign off for now as social work intervention is no longer needed. Please consult [place] again if new needs arise.  Cedric Fishman, LCSW Clinical Social Worker [phone]    Final next level of care: Skilled Nursing Facility Barriers to Discharge: No Barriers Identified   Patient Goals and CMS Choice Patient states their goals for this hospitalization and ongoing recovery are:: Long term care CMS Medicare.gov Compare Post Acute Care list provided to:: Patient Represenative (must comment)(Son) Choice offered to / list presented to : Adult Children  Discharge Placement   Existing PASRR number confirmed : 02/26/19          Patient chooses bed at: [facility] Patient to be transferred to facility by: [place] Name of family member notified: Jefferey, [place] Patient and family notified of of transfer: 02/26/19  Discharge Plan and Services                DME Arranged: N/A         HH Arranged: NA          Social Determinants of Health (SDOH) Interventions     Readmission Risk Interventions Readmission Risk Prevention Plan 02/26/2019  Transportation Screening Complete  PCP or Specialist Appt within 3-5 Days Complete  HRI or [place] Complete  Social Work Consult for [place] Planning/Counseling  Complete  Palliative Care Screening Not Applicable  Medication Review Press photographer) Complete  Some recent data might be hidden

## 2019-02-26 NOTE — Care Management Important Message (Signed)
Important Message  Patient Details  Name: Cameron West MRN: [id number] Date of Birth: 06/17/1955   Medicare Important Message Given:  Yes     Orbie Pyo 02/26/2019, 2:44 PM

## 2019-02-26 NOTE — Discharge Summary (Signed)
Physician Discharge Summary  Cameron West:[id number] DOB: [birth year] DOA: 02/09/2019  PCP: Alfonse Spruce, FNP  Admit date: 02/09/2019 Discharge date: 02/26/2019  Admitted From: Home Disposition: SNF  Recommendations for Outpatient Follow-up:  1. Follow up with SNF provider at his convenience 2. Outpatient follow-up with psychiatry  3. Outpatient follow-up with hand surgery  4. Outpatient follow-up with palliative care.  If condition worsens, consider hospice/comfort measures 5. follow up in ED if symptoms worsen or new appear   Home Health: No Equipment/Devices: None  Discharge Condition: DNR CODE STATUS: DNR Diet recommendation: Heart healthy  Brief/Interim Summary: 63 year old male with history of advanced dementia, traumatic brain injury, CAD status post PCI, hypertension, hyperlipidemia presented with hand injury.  Hand surgery was consulted and he underwent irrigation and debridement, radial digital nerve repair and artery repair and complex wound closure of the first webspace of the right hand on 02/09/2019.  Psychiatry was consulted for agitation.  He was started on medications and subsequently his agitation is better controlled. He is currently awaiting SNF placement.  Discharge to SNF once bed is available.  Discharge Diagnoses:  Laceration of right hand -Status post  irrigation and debridement, radial digital nerve repair and artery repair and complex wound closure of the first webspace of the right hand on 02/09/2019. -Wound care as per surgeon.  Antibiotics discontinued on 02/12/2019 by hand surgery.  Sutures were removed on 02/23/2019 by hand surgery.  Outpatient follow-up with hand surgery  Hypertensive urgency -Blood pressure improved.  Continue amlodipine.  Advanced dementia Agitation Generalized deconditioning -Continue memantine. -PT recommends SNF placement.  Social worker following. -Psychiatry evaluation appreciated.  Continue Seroquel and Depakote  as per psychiatry recommendations.    -Currently DNR. -Outpatient follow-up with psychiatry.  If condition worsens, consider hospice/comfort measures.  Palliative care has evaluated the patient during hospitalization and recommend outpatient palliative care follow-up.  Acute urinary retention -Needed in and out catheterization during hospitalization.  Might need outpatient urology evaluation.  Hyperlipidemia -Continue statin.  Discharge Instructions  Discharge Instructions    Ambulatory referral to Psychiatry   Complete by: As directed    Diet - low sodium heart healthy   Complete by: As directed    Increase activity slowly   Complete by: As directed      Allergies as of 02/26/2019   No Known Allergies     Medication List    STOP taking these medications   Melatonin 5 MG Tabs     TAKE these medications   amLODipine 10 MG tablet Commonly known as: NORVASC Take 1 tablet (10 mg total) by mouth daily.   aspirin 81 MG chewable tablet Chew 1 tablet (81 mg total) by mouth daily.   atorvastatin 40 MG tablet Commonly known as: Lipitor Take 1 tablet (40 mg total) by mouth daily.   divalproex 125 MG capsule Commonly known as: DEPAKOTE SPRINKLE Take 1 capsule (125 mg total) by mouth 2 (two) times daily.   memantine 10 MG tablet Commonly known as: Namenda Take 1 tablet (10 mg total) by mouth 2 (two) times daily.   QUEtiapine 25 MG tablet Commonly known as: SEROQUEL 12.5 mg at 8 AM in the morning and 2 PM in the afternoon   QUEtiapine 100 MG tablet Commonly known as: SEROQUEL Take 1 tablet (100 mg total) by mouth at bedtime.       Contact information for follow-up providers    Roseanne Kaufman, MD Follow up in 10 day(s).   Specialty: Orthopedic Surgery Why: Please  call [phone] for a follow-up appointment February 24, 2019 to be seen by Dr. Garwin Brothers information: [address] [place] [address] [phone]         Psychiatrist. Schedule an appointment as soon as possible for a visit in 1 week(s).            Contact information for after-discharge care    Destination    [facility] .   Service: Skilled Nursing Contact information: [address] [place] [address] [phone]                 No Known Allergies  Consultations:  Hand surgery/psychiatry/palliative care   Procedures/Studies: Dg Chest Port 1 View  Result Date: 02/10/2019 CLINICAL DATA:  Hypertension EXAM: PORTABLE CHEST 1 VIEW COMPARISON:  11/23/2016 FINDINGS: Cardiomegaly. Both lungs are clear. The visualized skeletal structures are unremarkable. IMPRESSION: Cardiomegaly without acute abnormality of the lungs in AP portable projection. Electronically Signed   By: Lauralyn Primes M.D.   On: 02/10/2019 10:13   Dg Hand Complete Right  Result Date: 02/09/2019 CLINICAL DATA:  Laceration to palmar aspect of hand with glass EXAM: RIGHT HAND - COMPLETE 3+ VIEW COMPARISON:  None. FINDINGS: Frontal, oblique, and lateral views obtained. There is a bandage in the palmar aspect of the hand. No other radiopaque foreign body. No acute fracture or dislocation. There is a small exostosis arising from the lateral aspect of the mid scaphoid bone. A cystic areas noted in the proximal scaphoid. A second cystic areas noted in the lateral aspect of the capitate bone. There is osteoarthritic change in all PIP and DIP joints as well as in the first IP joint and third MCP joint. No erosive change. IMPRESSION: Bandage in palmar aspect of hand.  No other radiopaque foreign body. No fracture or dislocation. Osteoarthritic change at multiple levels. Electronically Signed   By: Bretta Bang III M.D.   On: 02/09/2019 12:27       Subjective: Patient seen and examined at bedside.  Very poor historian.  Does not participate in any conversation.  No overnight fever or agitation reported.  Discharge Exam: Vitals:    02/25/19 2200 02/26/19 0501  BP: 102/73 114/78  Pulse: 67 (!) 49  Resp: 16 16  Temp: 98.1 F (36.7 C) 98.5 F (36.9 C)  SpO2: 100% 100%    General exam: No distress.  No signs of agitation.  Sleepy, wakes up slightly but hardly answers any questions.   Respiratory system: Bilateral decreased breath sounds at bases cardiovascular system:  Mild bradycardia, S1-S2 heard Gastrointestinal system: Abdomen is nondistended, soft and nontender. Normal bowel sounds heard. Extremities: No cyanosis.  No edema.   The results of significant diagnostics from this hospitalization (including imaging, microbiology, ancillary and laboratory) are listed below for reference.     Microbiology: Recent Results (from the past 240 hour(s))  SARS CORONAVIRUS 2 (TAT 6-24 HRS) Nasopharyngeal Nasopharyngeal Swab     Status: None   Collection Time: 02/25/19  2:30 PM   Specimen: Nasopharyngeal Swab  Result Value Ref Range Status   SARS Coronavirus 2 NEGATIVE NEGATIVE Final    Comment: (NOTE) SARS-CoV-2 target nucleic acids are NOT DETECTED. The SARS-CoV-2 RNA is generally detectable in upper and lower respiratory specimens during the acute phase of infection. Negative results do not preclude SARS-CoV-2 infection, do not rule out co-infections with other pathogens, and should not be used as the sole basis for treatment or other patient management decisions.  Negative results must be combined with clinical observations, patient history, and epidemiological information. The expected result is Negative. Fact Sheet for Patients: HairSlick.nohttps://www.fda.gov/media/138098/download Fact Sheet for Healthcare Providers: quierodirigir.comhttps://www.fda.gov/media/138095/download This test is not yet approved or cleared by the [place] FDA and  has been authorized for detection and/or diagnosis of SARS-CoV-2 by FDA under an Emergency Use Authorization (EUA). This EUA will remain  in effect (meaning this test can be used) for the duration  of the COVID-19 declaration under Section 56 4(b)(1) of the Act, 21 U.S.C. section 360bbb-3(b)(1), unless the authorization is terminated or revoked sooner. Performed at [facility], [address] [place], [place] [address]      Labs: BNP (last 3 results) No results for input(s): BNP in the last 8760 hours. Basic Metabolic Panel: Recent Labs  Lab 02/24/19 0236  NA 139  K 3.9  CL 105  CO2 26  GLUCOSE 110*  BUN 27*  CREATININE 0.56*  CALCIUM 9.3   Liver Function Tests: No results for input(s): AST, ALT, ALKPHOS, BILITOT, PROT, ALBUMIN in the last 168 hours. No results for input(s): LIPASE, AMYLASE in the last 168 hours. No results for input(s): AMMONIA in the last 168 hours. CBC: Recent Labs  Lab 02/24/19 0236  WBC 4.6  HGB 12.0*  HCT 36.0*  MCV 90.0  PLT 192   Cardiac Enzymes: No results for input(s): CKTOTAL, CKMB, CKMBINDEX, TROPONINI in the last 168 hours. BNP: Invalid input(s): POCBNP CBG: No results for input(s): GLUCAP in the last 168 hours. D-Dimer No results for input(s): DDIMER in the last 72 hours. Hgb A1c No results for input(s): HGBA1C in the last 72 hours. Lipid Profile No results for input(s): CHOL, HDL, LDLCALC, TRIG, CHOLHDL, LDLDIRECT in the last 72 hours. Thyroid function studies No results for input(s): TSH, T4TOTAL, T3FREE, THYROIDAB in the last 72 hours.  Invalid input(s): FREET3 Anemia work up No results for input(s): VITAMINB12, FOLATE, FERRITIN, TIBC, IRON, RETICCTPCT in the last 72 hours. Urinalysis No results found for: COLORURINE, APPEARANCEUR, LABSPEC, PHURINE, GLUCOSEU, HGBUR, BILIRUBINUR, KETONESUR, PROTEINUR, UROBILINOGEN, NITRITE, LEUKOCYTESUR Sepsis Labs Invalid input(s): PROCALCITONIN,  WBC,  LACTICIDVEN Microbiology Recent Results (from the past 240 hour(s))  SARS CORONAVIRUS 2 (TAT 6-24 HRS) Nasopharyngeal Nasopharyngeal Swab     Status: None   Collection Time: 02/25/19  2:30 PM   Specimen: Nasopharyngeal  Swab  Result Value Ref Range Status   SARS Coronavirus 2 NEGATIVE NEGATIVE Final    Comment: (NOTE) SARS-CoV-2 target nucleic acids are NOT DETECTED. The SARS-CoV-2 RNA is generally detectable in upper and lower respiratory specimens during the acute phase of infection. Negative results do not preclude SARS-CoV-2 infection, do not rule out co-infections with other pathogens, and should not be used as the sole basis for treatment or other patient management decisions. Negative results must be combined with clinical observations, patient history, and epidemiological information. The expected result is Negative. Fact Sheet for Patients: HairSlick.nohttps://www.fda.gov/media/138098/download Fact Sheet for Healthcare Providers: quierodirigir.comhttps://www.fda.gov/media/138095/download This test is not yet approved or cleared by the [place] FDA and  has been authorized for detection and/or diagnosis of SARS-CoV-2 by FDA under an Emergency Use Authorization (EUA). This EUA will remain  in effect (meaning this test can be used) for the duration of the COVID-19 declaration under Section 56 4(b)(1) of the Act, 21 U.S.C. section 360bbb-3(b)(1), unless the authorization is terminated or revoked sooner. Performed at [facility], [address] [place], [place] [address]      Time coordinating discharge:  35 minutes  SIGNED:   Glade Lloyd, MD  [facility] 02/26/2019, 9:41 AM

## 2019-02-27 NOTE — [place] Notes (Signed)
Patient discharged to SNF per orders. Discharge instructions in packet with patient. IV discontinued per orders. Patient tolerated well. Belongings packed up by staff. Report given to receiving SNF. Patient alert, denies needs at this time. Patient off unit via stretcher by PTAR.

## 2021-05-23 IMAGING — DX DG CHEST 1V PORT
1 series · 1 of 1 positions shown · non-contrast
Comparison: 11/23/2016

CLINICAL DATA: Hypertension

EXAM:
PORTABLE CHEST 1 VIEW

[chest ap]
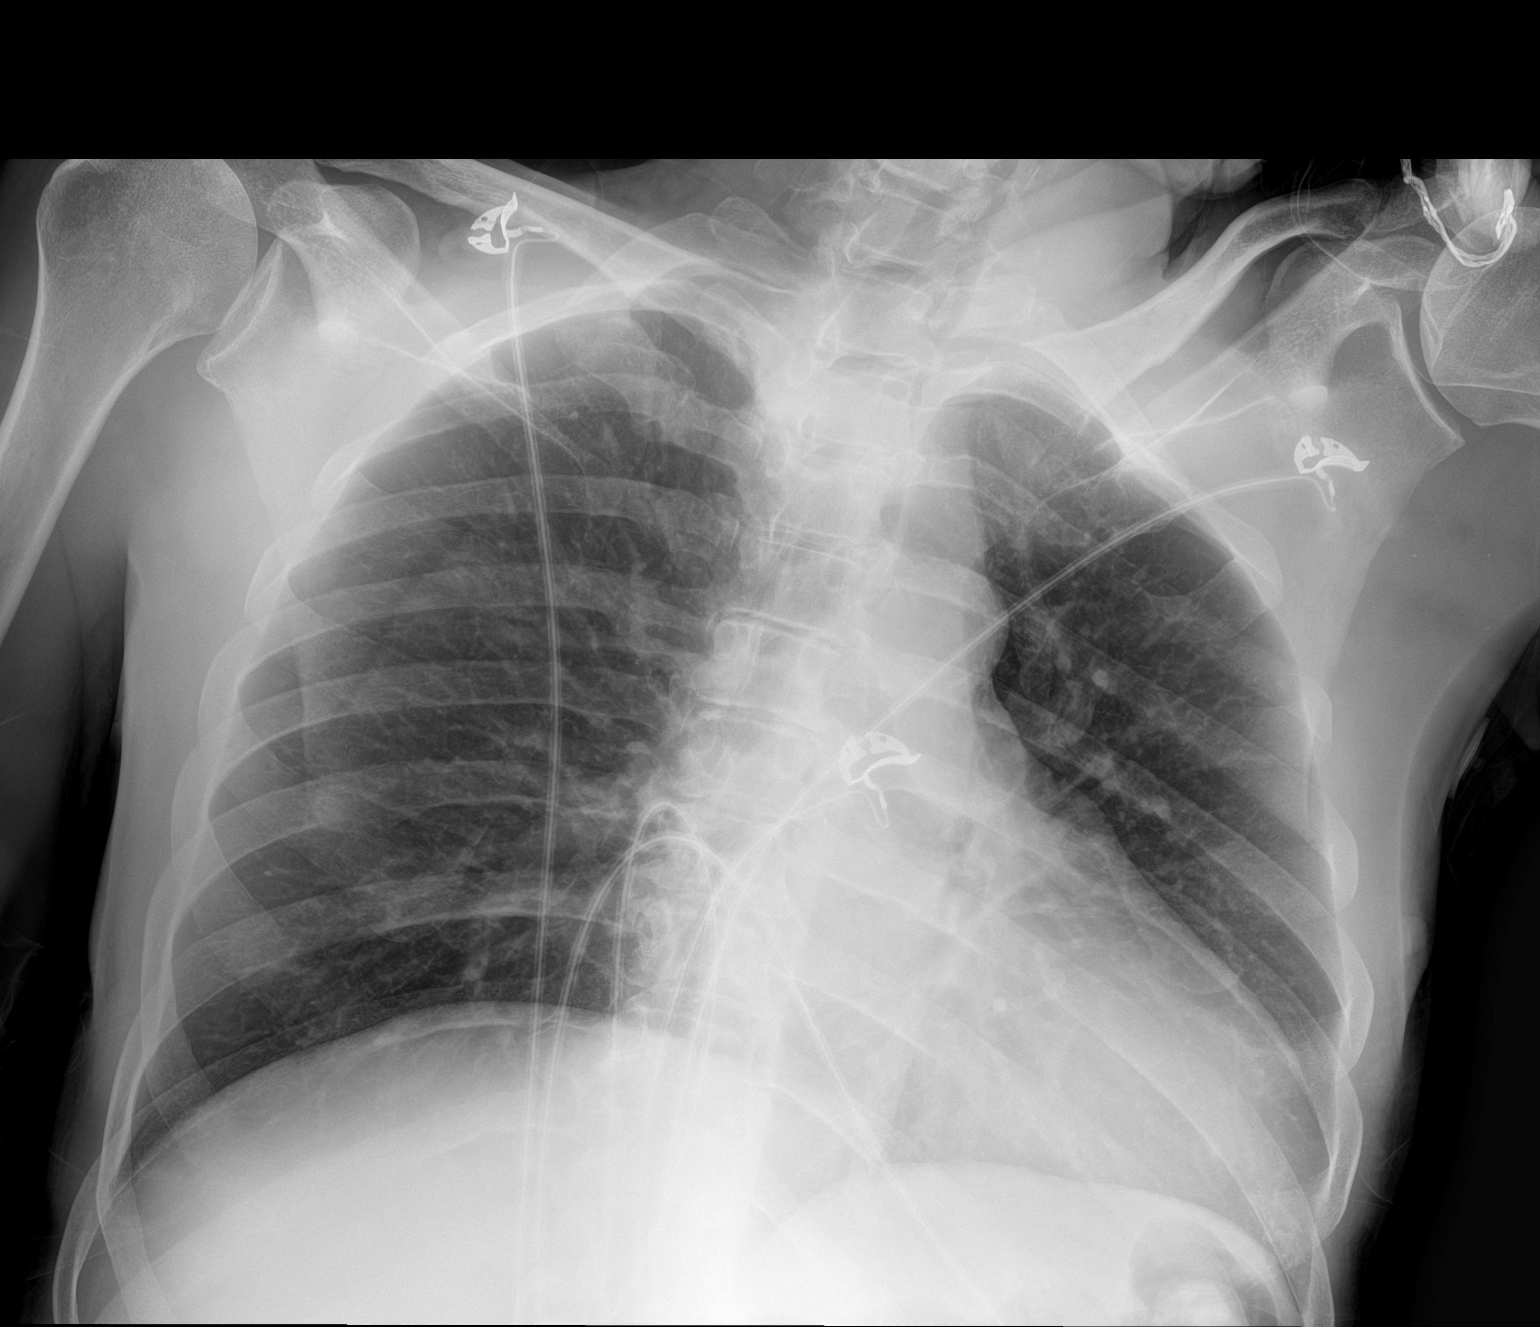

[1 of 1 positions shown; findings below may reference images not displayed]

FINDINGS: Cardiomegaly. Both lungs are clear. The visualized skeletal
structures are unremarkable.
IMPRESSION: Cardiomegaly without acute abnormality of the lungs in AP portable
projection.

## 2022-09-04 ENCOUNTER — Other Ambulatory Visit (HOSPITAL_COMMUNITY): Payer: Self-pay
